# Patient Record
Sex: Male | Born: 1940 | Race: White | Hispanic: No | Smoking: Former smoker
Health system: Southern US, Community
[De-identification: ages and names within clinical notes are randomized; demographics above are authoritative.]

## PROBLEM LIST (undated history)

## (undated) DIAGNOSIS — C801 Malignant (primary) neoplasm, unspecified: Secondary | ICD-10-CM

## (undated) DIAGNOSIS — C719 Malignant neoplasm of brain, unspecified: Secondary | ICD-10-CM

## (undated) DIAGNOSIS — C419 Malignant neoplasm of bone and articular cartilage, unspecified: Secondary | ICD-10-CM

## (undated) DIAGNOSIS — M1990 Unspecified osteoarthritis, unspecified site: Secondary | ICD-10-CM

## (undated) DIAGNOSIS — E119 Type 2 diabetes mellitus without complications: Secondary | ICD-10-CM

## (undated) DIAGNOSIS — C439 Malignant melanoma of skin, unspecified: Secondary | ICD-10-CM

## (undated) DIAGNOSIS — J449 Chronic obstructive pulmonary disease, unspecified: Secondary | ICD-10-CM

## (undated) DIAGNOSIS — I6529 Occlusion and stenosis of unspecified carotid artery: Secondary | ICD-10-CM

## (undated) DIAGNOSIS — I10 Essential (primary) hypertension: Secondary | ICD-10-CM

## (undated) HISTORY — DX: Occlusion and stenosis of unspecified carotid artery: I65.29

## (undated) HISTORY — DX: Type 2 diabetes mellitus without complications: E11.9

## (undated) HISTORY — DX: Essential (primary) hypertension: I10

## (undated) HISTORY — DX: Malignant melanoma of skin, unspecified: C43.9

## (undated) HISTORY — PX: TONSILLECTOMY: SUR1361

---

## 2005-11-25 ENCOUNTER — Encounter: Admission: RE | Admit: 2005-11-25 | Discharge: 2005-11-25 | Payer: Self-pay | Admitting: Family Medicine

## 2005-11-28 ENCOUNTER — Ambulatory Visit (HOSPITAL_COMMUNITY): Admission: RE | Admit: 2005-11-28 | Discharge: 2005-11-28 | Payer: Self-pay | Admitting: Family Medicine

## 2007-02-27 ENCOUNTER — Emergency Department (HOSPITAL_COMMUNITY): Admission: EM | Admit: 2007-02-27 | Discharge: 2007-02-27 | Payer: Self-pay | Admitting: Emergency Medicine

## 2009-05-14 ENCOUNTER — Emergency Department (HOSPITAL_COMMUNITY): Admission: EM | Admit: 2009-05-14 | Discharge: 2009-05-14 | Payer: Self-pay | Admitting: Emergency Medicine

## 2011-01-09 ENCOUNTER — Ambulatory Visit (HOSPITAL_COMMUNITY)
Admission: RE | Admit: 2011-01-09 | Discharge: 2011-01-09 | Disposition: A | Discharge status: Home / Self Care | Payer: Medicare Other | Source: Ambulatory Visit | Attending: Family Medicine | Admitting: Family Medicine

## 2011-01-09 ENCOUNTER — Other Ambulatory Visit (HOSPITAL_COMMUNITY): Payer: Self-pay | Admitting: Family Medicine

## 2011-01-09 DIAGNOSIS — M1610 Unilateral primary osteoarthritis, unspecified hip: Secondary | ICD-10-CM | POA: Insufficient documentation

## 2011-01-09 DIAGNOSIS — M5137 Other intervertebral disc degeneration, lumbosacral region: Secondary | ICD-10-CM | POA: Insufficient documentation

## 2011-01-09 DIAGNOSIS — R52 Pain, unspecified: Secondary | ICD-10-CM

## 2011-01-09 DIAGNOSIS — M51379 Other intervertebral disc degeneration, lumbosacral region without mention of lumbar back pain or lower extremity pain: Secondary | ICD-10-CM | POA: Insufficient documentation

## 2011-01-09 DIAGNOSIS — M545 Low back pain, unspecified: Secondary | ICD-10-CM | POA: Insufficient documentation

## 2011-01-09 DIAGNOSIS — M169 Osteoarthritis of hip, unspecified: Secondary | ICD-10-CM | POA: Insufficient documentation

## 2011-01-09 DIAGNOSIS — M25559 Pain in unspecified hip: Secondary | ICD-10-CM | POA: Insufficient documentation

## 2012-03-10 ENCOUNTER — Ambulatory Visit (HOSPITAL_COMMUNITY)
Admission: RE | Admit: 2012-03-10 | Discharge: 2012-03-10 | Disposition: A | Discharge status: Home / Self Care | Payer: Medicare Other | Source: Ambulatory Visit | Attending: Family Medicine | Admitting: Family Medicine

## 2012-03-10 DIAGNOSIS — M6281 Muscle weakness (generalized): Secondary | ICD-10-CM | POA: Insufficient documentation

## 2012-03-10 DIAGNOSIS — R262 Difficulty in walking, not elsewhere classified: Secondary | ICD-10-CM | POA: Insufficient documentation

## 2012-03-10 DIAGNOSIS — IMO0001 Reserved for inherently not codable concepts without codable children: Secondary | ICD-10-CM | POA: Insufficient documentation

## 2012-03-10 DIAGNOSIS — M545 Low back pain, unspecified: Secondary | ICD-10-CM | POA: Insufficient documentation

## 2012-03-10 NOTE — Evaluation (Signed)
Physical Therapy Evaluation  Patient Details  Name: Todd Poole MRN: [id number] Date of Birth: 1941/05/24  Today's Date: 03/10/2012 Time: 1302-1345 PT Time Calculation (min): 43 min Charges:1 eval Visit#: 1  of 8   Re-eval: 04/09/12 Assessment Diagnosis: LBP Next MD Visit: Dr. Parke Simmers - 2 months Prior Therapy: none  Past Medical History: No past medical history on file. Past Surgical History: No past surgical history on file.  Subjective Symptoms/Limitations Symptoms: Low Back Pain.  PMH: COPD, hyperlipidema, Medicaion: Spiriva and Naproxen Pertinent History: Pt is referred to PT for LBP.  He reports that he has had pain in his low back for years.  he bought a back brace to help control his pain.  He stopped smoking 4 years ago and since has put on 50lbs. His c/co are pain and decreased activity tolerance.  How long can you sit comfortably?: No difficulty How long can you stand comfortably?: Most pain when standing even for a few minutes.  How long can you walk comfortably?: 5 minutes and is limited from back pain and COPD, w/back brace he doesn't have increased pain. Needs to use a grocery cart in the store, which relieves his pain.  Patient Stated Goals: "I want to decrease my pain so that I can walk longer."  Pain Assessment Currently in Pain?: Yes Pain Score:   2 Pain Location: Back Pain Orientation: Left;Right Pain Type: Chronic pain Pain Onset: Other (comment) (a few years) Pain Frequency: Intermittent (only when standing and walking) Pain Relieving Factors: Naproxen, back brace, sitting Effect of Pain on Daily Activities: unable to walk and is unable to lose weight.   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  Leisure: Hobbies-yes (Comment) Comments: Pt enjoys watching TV, going site seeing [place], enjoys swimming.     Cognition/Observation Observation/Other Assessments Observations: chest breather.  Sits with UT elevated, difficulty relaxing even with max cueing.   Other Assessments: Comes in today wearing a back brace.   Sensation/Coordination/Flexibility/Functional Tests Coordination Gross Motor Movements are Fluid and Coordinated: No Coordination and Movement Description: Impaired motor control to TrA and multifidus Hodges Score TrA:  Multifidus: 3/10 (decreased to R multifidus Functional Tests Functional Tests: LEFS: 46/80  Assessment RLE Strength Right Hip Flexion: 4/5 Right Hip Extension: 3+/5 Right Hip ABduction: 4/5 Right Knee Flexion: 5/5 Right Knee Extension: 5/5 LLE Strength Left Hip Flexion: 4/5 Left Hip Extension: 3+/5 Left Hip ABduction: 3+/5 Left Knee Flexion: 5/5 Left Knee Extension: 5/5 Lumbar AROM Lumbar Flexion: decreased 25% -  Lumbar Extension: Decreased 50%; R quadrant decreased  Lumbar - Right Side Bend: WNL Lumbar - Left Side Bend: WNL Palpation Palpation: increased muscle spasms to lumbar parapspinals without increased pain.  Lumbar SP mobility WNL  Mobility/Balance  Ambulation/Gait Ambulation/Gait: Yes Gait Pattern: Antalgic Posture/Postural Control Posture/Postural Control: Postural limitations Postural Limitations: lower and upper cross syndromes    Exercise/Treatments Supine Ab Set: Limitations AB Set Limitations: NMR for activation Bridge: 10 reps Other Supine Lumbar Exercises: NMR for PFC  Prone  Straight Leg Raise: 10 reps Other Prone Lumbar Exercises: NMR for multifidus activation  Physical Therapy Assessment and Plan PT Assessment and Plan Clinical Impression Statement: Pt is a 71 year old male referred to PT for LBP and denies knee pain. After evaluation pt also describes significant generalized weakness and decreased activity tolerance. Pt will benefit from skilled therapeutic intervention in order to improve on  the following deficits:  Pain;Improper body mechanics;Increased muscle spasms;Increased fascial restricitons;Impaired perceived functional ability;Decreased activity tolerance;Impaired flexibility;Decreased strength;Decreased range of motion;Other (comment) (impaired breathing) Rehab Potential: Good PT Frequency: Min 2X/week PT Duration: 6 weeks PT Treatment/Interventions: Gait training;Stair training;Functional mobility training;Therapeutic activities;Therapeutic exercise;Balance training;Neuromuscular re-education;Patient/family education;Manual techniques;Modalities PT Plan: Continue with core strengthening.  Add bike for activity tolerance. continue with core stabilization and review HEP (heel/toe roll in and outs, clams, PFC, TrA and mulitifidus).     Goals Home Exercise Program Pt will Perform Home Exercise Program: Independently PT Goal: Perform Home Exercise Program - Progress: Goal set today PT Short Term Goals Time to Complete Short Term Goals: 2 weeks PT Short Term Goal 1: Pt will imporve core strength to 5/10 for multifidus and TrA musculature. PT Short Term Goal 2: Pt will improve LE strength by 1/2 muscle grade. PT Short Term Goal 3: Pt will improve LE flexibility to quadriceps, hip flexors and hamstrings.  PT Short Term Goal 4: Pt will improve LE flexibility to WNL in order to present with appropriate sitting, standing and walking posture w/mod cueing during an entire therapy regime.  PT Long Term Goals Time to Complete Long Term Goals: Other (comment) (6 weeks) PT Long Term Goal 1: Pt will improve LE and core strength to WNL in order to tolerate walking for greater than 1 hour to return to community mobility to assist with weight loss.  PT Long Term Goal 2: Pt will improve core coordination and activity tolerance in order to tolerate standing for greater than 30 minutes to complete household activities.  Long Term Goal 3: Pt will improve his LEFS to 56/80 for improved percieved functional ability.     Problem List Patient Active Problem List  Diagnosis  . Lumbago  . Muscle pain  . Muscle weakness (generalized)    PT - End of Session Activity Tolerance: Patient tolerated treatment well General Behavior During Session: [facility] for tasks performed Cognition: [facility] for tasks performed PT Plan of Care PT Home Exercise Plan: see scanned report PT Patient Instructions: importance of HEP, diaphragmatic breathing technique.  Consulted and Agree with Plan of Care: Patient  Hilma Steinhilber 03/10/2012, 4:49 PM  Physician Documentation Your signature is required to indicate approval of the treatment plan as stated above.  Please sign and either send electronically or make a copy of this report for your files and return this physician signed original.   Please mark one 1.__approve of plan  2. ___approve of plan with the following conditions.   ______________________________                                                          _____________________ Physician Signature  Date  

## 2012-03-12 ENCOUNTER — Ambulatory Visit (HOSPITAL_COMMUNITY)
Admission: RE | Admit: 2012-03-12 | Discharge: 2012-03-12 | Disposition: A | Discharge status: Home / Self Care | Payer: Medicare Other | Source: Ambulatory Visit | Attending: Family Medicine | Admitting: Family Medicine

## 2012-03-12 NOTE — Progress Notes (Signed)
Physical Therapy Treatment Patient Details  Name: Todd Poole MRN: [id number] Date of Birth: Feb 01, 1941  Today's Date: 03/12/2012 Time: 2956-2130 PT Time Calculation (min): 39 min Charges: 15' NMR, 24' TE Visit#: 2  of 8   Re-eval: 04/09/12   Subjective: Symptoms/Limitations Symptoms: Actually I am feeling a little bit better.  I am not sure if it is the back brace or the exercises, but I am doing pretty well.  Pain Assessment Currently in Pain?: Yes Pain Score:   1 Pain Location: Back Pain Orientation: Mid  Precautions/Restrictions     Exercise/Treatments Aerobic Stationary Bike: 6' @ 2.5 w/cueing to stay above 50 RPM for activity tolerance Machines for Strengthening   Standing Heel Raises: Limitations;20 reps Heel Raises Limitations: Toe Raises 10 reps, sacral/sternal cueing for posture Functional Squats: 10 reps Row: Both;10 reps;Theraband (cueing for proper posture) Theraband Level (Row): Level 4 (Blue) Shoulder Extension: Both;10 reps;Theraband Theraband Level (Shoulder Extension): Level 4 (Blue) Shoulder ADduction: Both;10 reps;Theraband Theraband Level (Shoulder Adduction): Level 4 (Blue) Seated   Supine Ab Set: Limitations AB Set Limitations: NMR for ab set and PFC w/use of VC's and TC's through TrA.  PFC 10x10 sec hold w/10 sec relax after NMR established.  Bridge: 15 reps Other Supine Lumbar Exercises: Diaphragmatic breathing instruction x5 Prone  Straight Leg Raise: Limitations Straight Leg Raises Limitations: NMR for correct activitation, has signficant compensations Other Prone Lumbar Exercises: Knee flexion x10 3#, BLE  Physical Therapy Assessment and Plan PT Assessment and Plan Clinical Impression Statement: Pt reports successful PF activation which is palpated through the TrA.  Able to hold 10x10 sec holds w/use of max multimodal cueing.  Continues to have significant weakness to posteior muscles with demonstration of significant compensations  with hip extension.  Pt will benefit from skilled therapeutic intervention in order to improve on the following deficits:  (impaired breathing) PT Plan: Continue with core strengthening.  Add bike for activity tolerance. continue with core stabilization and review HEP (heel/toe roll in and outs, clams, PFC, TrA and mulitifidus).     Goals    Problem List Patient Active Problem List  Diagnosis  . Lumbago  . Muscle pain  . Muscle weakness (generalized)    PT - End of Session Activity Tolerance: Patient tolerated treatment well General Behavior During Session: [facility] for tasks performed Cognition: [facility] for tasks performed  Odella Appelhans 03/12/2012, 11:19 AM

## 2012-03-17 ENCOUNTER — Ambulatory Visit (HOSPITAL_COMMUNITY): Payer: Medicare Other | Admitting: Physical Therapy

## 2012-03-18 ENCOUNTER — Ambulatory Visit (HOSPITAL_COMMUNITY): Payer: Medicare Other | Admitting: *Deleted

## 2012-03-24 ENCOUNTER — Ambulatory Visit (HOSPITAL_COMMUNITY)
Admission: RE | Admit: 2012-03-24 | Discharge: 2012-03-24 | Disposition: A | Discharge status: Home / Self Care | Payer: Medicare Other | Source: Ambulatory Visit | Attending: Family Medicine | Admitting: Family Medicine

## 2012-03-24 DIAGNOSIS — M6281 Muscle weakness (generalized): Secondary | ICD-10-CM | POA: Insufficient documentation

## 2012-03-24 DIAGNOSIS — M545 Low back pain, unspecified: Secondary | ICD-10-CM | POA: Insufficient documentation

## 2012-03-24 DIAGNOSIS — IMO0001 Reserved for inherently not codable concepts without codable children: Secondary | ICD-10-CM | POA: Insufficient documentation

## 2012-03-24 DIAGNOSIS — R262 Difficulty in walking, not elsewhere classified: Secondary | ICD-10-CM | POA: Insufficient documentation

## 2012-03-24 NOTE — Progress Notes (Signed)
Physical Therapy Treatment Patient Details  Name: Todd Poole MRN: [id number] Date of Birth: [birth year]  Today's Date: 03/24/2012 Time: 4098-1191 PT Time Calculation (min): 37 min  Visit#: 3  of 8   Re-eval: 04/09/12 Charges: Therex x 30'   Subjective: Symptoms/Limitations Symptoms: Pt states taht his back feels weak. Pain Assessment Currently in Pain?: Yes Pain Score:   2 Pain Location: Back Pain Orientation: Mid   Exercise/Treatments Aerobic Stationary Bike: 6' @ 3.0 w/cueing to stay above 50 RPM for activity tolerance Standing Heel Raises: 20 reps Heel Raises Limitations: Toe Raises 10 reps, sacral/sternal cueing for posture Functional Squats: 10 reps (with max cueing for posture) Row: Both;10 reps;Theraband Theraband Level (Row): Level 4 (Blue) Shoulder Extension: Both;10 reps;Theraband Theraband Level (Shoulder Extension): Level 4 (Blue) Shoulder ADduction: Both;10 reps;Theraband Theraband Level (Shoulder Adduction): Level 4 (Blue) Supine Ab Set: 10 reps Bridge: 15 reps;5 seconds Prone  Straight Leg Raise: 10 reps Other Prone Lumbar Exercises: Knee flexion x15 3#, BLE  Physical Therapy Assessment and Plan PT Assessment and Plan Clinical Impression Statement: Pt requires moderates cueing throughout session for posture on all exercises. Pt does complete TrA contraction well this session. Pt reports no increase in pain throughout session. Pt will benefit from skilled therapeutic intervention in order to improve on the following deficits:  (impaired breathing) PT Plan: Continue with core strengthening per PT POC.     Problem List Patient Active Problem List  Diagnosis  . Lumbago  . Muscle pain  . Muscle weakness (generalized)    PT - End of Session Activity Tolerance: Patient tolerated treatment well General Behavior During Session: [facility] for tasks performed Cognition: [facility] for tasks performed  Seth Bake, PTA 03/24/2012, 4:21 PM

## 2012-03-26 ENCOUNTER — Ambulatory Visit (HOSPITAL_COMMUNITY)
Admission: RE | Admit: 2012-03-26 | Discharge: 2012-03-26 | Disposition: A | Discharge status: Home / Self Care | Payer: Medicare Other | Source: Ambulatory Visit | Attending: Family Medicine | Admitting: Family Medicine

## 2012-03-26 NOTE — Progress Notes (Signed)
Physical Therapy Treatment Patient Details  Name: Todd Poole MRN: [id number] Date of Birth: Feb 05, 1941  Today's Date: 03/26/2012 Time: 8295-6213 PT Time Calculation (min): 51 min  Visit#: 4  of 8   Re-eval: 04/09/12 Charges: Therex x 43'    Subjective: Symptoms/Limitations Symptoms: Pt states he is doing his exercises every other day. Pain Assessment Currently in Pain?: Yes Pain Score:   2 Pain Location: Back Pain Orientation: Mid   Exercise/Treatments Aerobic Stationary Bike: 6' @ 3.0 w/cueing to stay above 50 RPM for activity tolerance Standing Heel Raises:  (Time) Functional Squats:  (Time) Row: Both;10 reps;Theraband Theraband Level (Row): Level 4 (Blue) Shoulder Extension: Both;10 reps;Theraband Theraband Level (Shoulder Extension): Level 4 (Blue) Shoulder ADduction: Both;10 reps;Theraband Theraband Level (Shoulder Adduction): Level 4 (Blue) Supine Ab Set: 10 reps Bent Knee Raise: 10 reps Bridge: 15 reps;5 seconds Sidelying Clam: 5 reps;Limitations Clam Limitations: 10" holds Prone  Straight Leg Raise: 10 reps Other Prone Lumbar Exercises: Knee flexion x15 5#, BLE Other Prone Lumbar Exercises: Heel squeezes 10x5"  Physical Therapy Assessment and Plan PT Assessment and Plan Clinical Impression Statement: Pt completes therex with improved form but continues to require min-mod cueing. Pt displays gluteal weakness with prone SLR and heel squeezes. Pt reports pain decrease to 1/10 at end of session. Pt will benefit from skilled therapeutic intervention in order to improve on the following deficits:  (impaired breathing) PT Plan: Continue with core strengthening per PT POC.     Problem List Patient Active Problem List  Diagnosis  . Lumbago  . Muscle pain  . Muscle weakness (generalized)    PT - End of Session Activity Tolerance: Patient tolerated treatment well General Behavior During Session: [facility] for tasks performed Cognition: [facility] for tasks  performed   Seth Bake, PTA 03/26/2012, 2:06 PM

## 2012-03-31 ENCOUNTER — Ambulatory Visit (HOSPITAL_COMMUNITY)
Admission: RE | Admit: 2012-03-31 | Discharge: 2012-03-31 | Disposition: A | Discharge status: Home / Self Care | Payer: Medicare Other | Source: Ambulatory Visit | Attending: Family Medicine | Admitting: Family Medicine

## 2012-03-31 ENCOUNTER — Other Ambulatory Visit (HOSPITAL_COMMUNITY): Payer: Self-pay | Admitting: Family Medicine

## 2012-03-31 DIAGNOSIS — M1250 Traumatic arthropathy, unspecified site: Secondary | ICD-10-CM

## 2012-03-31 DIAGNOSIS — I7090 Unspecified atherosclerosis: Secondary | ICD-10-CM | POA: Insufficient documentation

## 2012-03-31 DIAGNOSIS — M4120 Other idiopathic scoliosis, site unspecified: Secondary | ICD-10-CM | POA: Insufficient documentation

## 2012-03-31 NOTE — Progress Notes (Signed)
Physical Therapy Treatment Patient Details  Name: Todd Poole MRN: [id number] Date of Birth: November 17, 1940  Today's Date: 03/31/2012 Time: 4098-1191 PT Time Calculation (min): 43 min Visit#: 5  of 8   Re-eval: 04/09/12 Charges:  Therex 40'    Subjective: Symptoms/Limitations Symptoms: Pt. states he is no worse/no better.  LBP is about the same. Pain Assessment Currently in Pain?: Yes Pain Score:   2 Pain Location: Back Pain Orientation: Mid   Exercise/Treatments Aerobic Stationary Bike: 6' @ 3.0 w/cueing to stay above 50 RPM for activity tolerance Standing Heel Raises: 20 reps Heel Raises Limitations: Toe Raises 20 reps, sacral/sternal cueing for posture Functional Squats: 10 reps Row: Both;15 reps;Theraband Theraband Level (Row): Level 4 (Blue) Shoulder Extension: Both;15 reps;Theraband Theraband Level (Shoulder Extension): Level 4 (Blue) Shoulder ADduction: Both;15 reps;Theraband Theraband Level (Shoulder Adduction): Level 4 (Blue) Supine Ab Set: 10 reps Bent Knee Raise: 10 reps Bridge: 15 reps;5 seconds Sidelying Clam: 5 reps;Limitations Clam Limitations: 10" holds Prone  Straight Leg Raise: 10 reps Other Prone Lumbar Exercises: Knee flexion x15 5#, BLE Other Prone Lumbar Exercises: Heel squeezes 15x5"     Physical Therapy Assessment and Plan PT Assessment and Plan Clinical Impression Statement: Pt. able to complete all therex, continues to require cueing for form/breathing. Prone SLR most difficult and low coordination/control performing hamstring curls. PT Plan: Continue with core strengthening per PT POC     Problem List Patient Active Problem List  Diagnosis  . Lumbago  . Muscle pain  . Muscle weakness (generalized)    PT - End of Session Activity Tolerance: Patient tolerated treatment well General Behavior During Session: [facility] for tasks performed Cognition: [facility] for tasks performed   Lurena Nida, PTA/CLT 03/31/2012, 1:52 PM

## 2012-04-02 ENCOUNTER — Ambulatory Visit (HOSPITAL_COMMUNITY)
Admission: RE | Admit: 2012-04-02 | Discharge: 2012-04-02 | Disposition: A | Discharge status: Home / Self Care | Payer: Medicare Other | Source: Ambulatory Visit | Attending: Family Medicine | Admitting: Family Medicine

## 2012-04-02 NOTE — Progress Notes (Signed)
Physical Therapy Discharge Summary and Treatment  Patient Details  Name: Todd Poole MRN: [id number] Date of Birth: 08/10/1941  Today's Date: 04/02/2012 Time: 1302-1338 PT Time Calculation (min): 36 min  Visit#: 6  of 8   Re-eval: 04/09/12 Charges: MMT x 1 ROMM x 1 Self care x 10'  Subjective Symptoms/Limitations Symptoms: Pt reprots he has not pain at rest. Pt states that when he's walking his pain goes up to 2/10. Pain Assessment Currently in Pain?: No/denies   Assessment RLE Strength Right Hip Flexion:  (4+/5 was 4/5) Right Hip Extension: 4/5 (was 3+/5) Right Hip ABduction: 5/5 (was 4/5) Right Knee Flexion: 5/5 (was 5/5) Right Knee Extension: 5/5 (was 5/5) LLE Strength Left Hip Flexion: 5/5 (was 4/5) Left Hip Extension: 4/5 (was 3+/5) Left Hip ABduction:  (4+/5 was 3+/5) Left Knee Flexion: 5/5 (was 5/5) Left Knee Extension: 5/5 (was 5/5) Lumbar AROM Lumbar Flexion: decreased 15% (was decreased 25%) Lumbar Extension: Decreased 20%; R quadrant decreased  (was decreased 50%) Lumbar - Right Side Bend: WNL Lumbar - Left Side Bend: WNL  LEFS remains 46/80  Exercise/Treatments  Stretches Passive Hamstring Stretch: 1 rep;30 seconds (for HEP)  Physical Therapy Assessment and Plan PT Assessment and Plan Clinical Impression Statement: Pt displays improvements in strength and ROM. Pt states that he feels a significant increase in his strength. Pt would benefit from continued therapy. Pt was made aware of this. Pt would like to continue HEP at home and D/C from therapy. Pt is content with his progress with therapy. PT Plan: Recommend D/C to HEP per pt request.    Goals Home Exercise Program Pt will Perform Home Exercise Program: Independently PT Short Term Goals Time to Complete Short Term Goals: 2 weeks PT Short Term Goal 1: Pt will imporve core strength to 5/10 for multifidus and TrA musculature. PT Short Term Goal 1 - Progress: Met PT Short Term Goal 2: Pt will  improve LE strength by 1/2 muscle grade. PT Short Term Goal 2 - Progress: Met PT Short Term Goal 3: Pt will improve LE flexibility to quadriceps, hip flexors and hamstrings.  PT Short Term Goal 3 - Progress: Progressing toward goal PT Short Term Goal 4: Pt will improve LE flexibility to WNL in order to present with appropriate sitting, standing and walking posture w/mod cueing during an entire therapy regime.  PT Short Term Goal 4 - Progress: Progressing toward goal PT Long Term Goals Time to Complete Long Term Goals: Other (comment) (6 weeks) PT Long Term Goal 1: Pt will improve LE and core strength to WNL in order to tolerate walking for greater than 1 hour to return to community mobility to assist with weight loss.  PT Long Term Goal 1 - Progress: Partly met PT Long Term Goal 2: Pt will improve core coordination and activity tolerance in order to tolerate standing for greater than 30 minutes to complete household activities.  PT Long Term Goal 2 - Progress: Progressing toward goal (Pt can stand 10-15' before needing to sit) Long Term Goal 3: Pt will improve his LEFS to 56/80 for improved percieved functional ability.   Long Term Goal 3 Progress: Not met (Remained 46/80)  Problem List Patient Active Problem List  Diagnosis  . Lumbago  . Muscle pain  . Muscle weakness (generalized)    PT - End of Session Activity Tolerance: Patient tolerated treatment well General Behavior During Session: [facility] for tasks performed Cognition: [facility] for tasks performed PT Plan of Care PT Home Exercise Plan:  Passive hamstring stretch and scapular tband exercises given.   Seth Bake, PTA 04/02/2012, 4:37 PM  Physician Documentation Your signature is required to indicate approval of the treatment plan as stated above.  Please sign and either send electronically or make a copy of this report for your files and return this physician signed original.   Please mark one 1.__approve of plan  2. ___approve  of plan with the following conditions.   ______________________________                                                          _____________________ Physician Signature                                                                                                             Date

## 2012-05-21 ENCOUNTER — Encounter (HOSPITAL_COMMUNITY): Payer: Self-pay | Admitting: *Deleted

## 2012-05-21 ENCOUNTER — Emergency Department (HOSPITAL_COMMUNITY)
Admission: EM | Admit: 2012-05-21 | Discharge: 2012-05-22 | Disposition: A | Discharge status: Home / Self Care | Payer: Medicare Other | Attending: Emergency Medicine | Admitting: Emergency Medicine

## 2012-05-21 DIAGNOSIS — J4489 Other specified chronic obstructive pulmonary disease: Secondary | ICD-10-CM | POA: Insufficient documentation

## 2012-05-21 DIAGNOSIS — Z87891 Personal history of nicotine dependence: Secondary | ICD-10-CM | POA: Insufficient documentation

## 2012-05-21 DIAGNOSIS — J449 Chronic obstructive pulmonary disease, unspecified: Secondary | ICD-10-CM | POA: Insufficient documentation

## 2012-05-21 DIAGNOSIS — M129 Arthropathy, unspecified: Secondary | ICD-10-CM | POA: Insufficient documentation

## 2012-05-21 DIAGNOSIS — M7989 Other specified soft tissue disorders: Secondary | ICD-10-CM | POA: Insufficient documentation

## 2012-05-21 HISTORY — DX: Chronic obstructive pulmonary disease, unspecified: J44.9

## 2012-05-21 HISTORY — DX: Unspecified osteoarthritis, unspecified site: M19.90

## 2012-05-21 NOTE — ED Notes (Signed)
Pt reports swelling in right lower extremity for the past 2 weeks that has not gone down as normal.  pitting edema noted.

## 2012-05-21 NOTE — ED Notes (Signed)
Triage EMT states that pt has left without informing staff of leaving

## 2012-05-21 NOTE — ED Notes (Signed)
Pt found waiting in waiting room

## 2012-05-21 NOTE — ED Notes (Signed)
C/o swelling to right foot for 2 weeks per pt., SOB due to COPD and has quit smoking

## 2012-05-22 ENCOUNTER — Ambulatory Visit (HOSPITAL_COMMUNITY)
Admit: 2012-05-22 | Discharge: 2012-05-22 | Disposition: A | Discharge status: Home / Self Care | Payer: Medicare Other | Source: Ambulatory Visit | Attending: Emergency Medicine | Admitting: Emergency Medicine

## 2012-05-22 DIAGNOSIS — M7989 Other specified soft tissue disorders: Secondary | ICD-10-CM | POA: Insufficient documentation

## 2012-05-22 DIAGNOSIS — M79609 Pain in unspecified limb: Secondary | ICD-10-CM | POA: Insufficient documentation

## 2012-05-22 NOTE — ED Provider Notes (Signed)
History     CSN: [id number]  Arrival date & time 05/21/12  2047   First MD Initiated Contact with Patient 05/22/12 0040      Chief Complaint  Patient presents with  . Leg Swelling    (Consider location/radiation/quality/duration/timing/severity/associated sxs/prior treatment) The history is provided by the patient.  patient is a 71 year old male presents with concern for swelling to his right foot and leg that has been going on for about 2 weeks. Patient does have baseline shortness of breath due to COPD but no worsening of that chest pain. Patient is followed by Dr. Parke Simmers.  The foot has no pain there was no recent injury.  Past Medical History  Diagnosis Date  . COPD (chronic obstructive pulmonary disease)   . Arthritis     History reviewed. No pertinent past surgical history.  History reviewed. No pertinent family history.  History  Substance Use Topics  . Smoking status: Former Games developer  . Smokeless tobacco: Not on file  . Alcohol Use: No      Review of Systems  Constitutional: Negative for fever.  HENT: Negative for neck pain.   Eyes: Negative for visual disturbance.  Respiratory: Positive for shortness of breath.   Cardiovascular: Negative for chest pain.  Gastrointestinal: Negative for abdominal pain.  Genitourinary: Negative for dysuria.  Musculoskeletal: Negative for back pain.  Skin: Positive for rash.  Neurological: Negative for headaches.  Hematological: Does not bruise/bleed easily.    Allergies  Review of patient's allergies indicates no known allergies.  Home Medications  No current outpatient prescriptions on file.  BP 157/55  Pulse 68  Temp 97.7 F (36.5 C) (Oral)  Resp 18  Ht 5\' 7"  (1.702 m)  Wt 226 lb 9 oz (102.768 kg)  BMI 35.48 kg/m2  SpO2 99%  Physical Exam  Nursing note and vitals reviewed. Constitutional: He is oriented to person, place, and time. He appears well-developed and well-nourished. No distress.  HENT:  Head:  Normocephalic and atraumatic.  Mouth/Throat: Oropharynx is clear and moist.  Eyes: Conjunctivae and EOM are normal. Pupils are equal, round, and reactive to light.  Neck: Normal range of motion. Neck supple.  Cardiovascular: Normal rate, regular rhythm and normal heart sounds.   No murmur heard. Pulmonary/Chest: Effort normal and breath sounds normal. He has no wheezes.  Abdominal: Soft. Bowel sounds are normal. There is no tenderness.  Musculoskeletal: He exhibits edema. He exhibits no tenderness.  Neurological: He is alert and oriented to person, place, and time. No cranial nerve deficit. He exhibits normal muscle tone. Coordination normal.       Cap refill to both lower extremities is 1 second or better. Patient has swelling of the distal part of the right foot and swelling of the leg. Has resolving poison ivy rash on both feet. No palpable cords in the back of the right leg swelling only goes up to the knee. But most of this at the ankle and the distal foot.  Skin: Skin is warm. No rash noted.    ED Course  Procedures (including critical care time)  Labs Reviewed - No data to display No results found.   1. Leg swelling       MDM  Patient with several days to week history of right leg swelling and right foot swelling no chest pain or new shortness of breath he has a history of COPD. Have arranged for Doppler study later this morning patient will return to have it done. Patient has primary care doctor  he can followup with. No significant swelling of the left extremity.        Shelda Jakes, MD 05/22/12 [phone]

## 2012-05-22 NOTE — ED Notes (Signed)
Pt alert & oriented x4, stable gait. Patient given discharge instructions, paperwork & prescription(s). Patient  instructed to stop at the registration desk to finish any additional paperwork. Patient verbalized understanding. Pt left department w/ no further questions. 

## 2012-09-07 ENCOUNTER — Encounter: Payer: Self-pay | Admitting: Family Medicine

## 2012-09-07 ENCOUNTER — Ambulatory Visit (INDEPENDENT_AMBULATORY_CARE_PROVIDER_SITE_OTHER): Payer: Medicare Other | Admitting: Family Medicine

## 2012-09-07 VITALS — BP 140/70 | HR 90 | Resp 15 | Ht 66.5 in | Wt 232.1 lb

## 2012-09-07 DIAGNOSIS — R030 Elevated blood-pressure reading, without diagnosis of hypertension: Secondary | ICD-10-CM

## 2012-09-07 DIAGNOSIS — J449 Chronic obstructive pulmonary disease, unspecified: Secondary | ICD-10-CM

## 2012-09-07 DIAGNOSIS — R7309 Other abnormal glucose: Secondary | ICD-10-CM

## 2012-09-07 DIAGNOSIS — Z85820 Personal history of malignant melanoma of skin: Secondary | ICD-10-CM

## 2012-09-07 DIAGNOSIS — M7989 Other specified soft tissue disorders: Secondary | ICD-10-CM

## 2012-09-07 DIAGNOSIS — I10 Essential (primary) hypertension: Secondary | ICD-10-CM

## 2012-09-07 DIAGNOSIS — R7303 Prediabetes: Secondary | ICD-10-CM

## 2012-09-07 MED ORDER — FLUTICASONE-SALMETEROL 250-50 MCG/DOSE IN AEPB: 1.0000 | INHALATION_SPRAY | Freq: Two times a day (BID) | RESPIRATORY_TRACT | Status: DC

## 2012-09-07 MED ORDER — HYDROCHLOROTHIAZIDE 25 MG PO TABS: 25.0000 mg | ORAL_TABLET | Freq: Every day | ORAL | Status: DC

## 2012-09-07 NOTE — Patient Instructions (Addendum)
Start HCTZ for your leg swelling and blood pressure New inhaler advair for your breathing Get labs done today, we will call with results Handi-cap form F/U 6 weeks

## 2012-09-08 LAB — CBC
HCT: 45.5 % (ref 39.0–52.0)
Hemoglobin: 15.6 g/dL (ref 13.0–17.0)
MCHC: 34.3 g/dL (ref 30.0–36.0)
MCV: 90.5 fL (ref 78.0–100.0)
RBC: 5.03 MIL/uL (ref 4.22–5.81)
RDW: 13.3 % (ref 11.5–15.5)
WBC: 6.3 10*3/uL (ref 4.0–10.5)

## 2012-09-08 LAB — COMPREHENSIVE METABOLIC PANEL
AST: 20 U/L (ref 0–37)
Albumin: 4.4 g/dL (ref 3.5–5.2)
Alkaline Phosphatase: 45 U/L (ref 39–117)
BUN: 19 mg/dL (ref 6–23)
CO2: 28 mEq/L (ref 19–32)
Calcium: 9.2 mg/dL (ref 8.4–10.5)
Creat: 1.47 mg/dL — ABNORMAL HIGH (ref 0.50–1.35)
Glucose, Bld: 110 mg/dL — ABNORMAL HIGH (ref 70–99)
Total Bilirubin: 0.5 mg/dL (ref 0.3–1.2)

## 2012-09-08 LAB — HEMOGLOBIN A1C: Mean Plasma Glucose: 140 mg/dL — ABNORMAL HIGH (ref <117)

## 2012-09-08 LAB — LIPID PANEL
Cholesterol: 143 mg/dL (ref 0–200)
HDL: 35 mg/dL — ABNORMAL LOW (ref >39)

## 2012-09-11 ENCOUNTER — Encounter: Payer: Self-pay | Admitting: Family Medicine

## 2012-09-11 DIAGNOSIS — J449 Chronic obstructive pulmonary disease, unspecified: Secondary | ICD-10-CM | POA: Insufficient documentation

## 2012-09-11 DIAGNOSIS — M7989 Other specified soft tissue disorders: Secondary | ICD-10-CM | POA: Insufficient documentation

## 2012-09-11 DIAGNOSIS — Z85820 Personal history of malignant melanoma of skin: Secondary | ICD-10-CM | POA: Insufficient documentation

## 2012-09-11 DIAGNOSIS — I10 Essential (primary) hypertension: Secondary | ICD-10-CM | POA: Insufficient documentation

## 2012-09-11 NOTE — Assessment & Plan Note (Signed)
Trial of advair for breathing, obtain records

## 2012-09-11 NOTE — Progress Notes (Signed)
  Subjective:    Patient ID: Todd Poole, male    DOB: 05-19-1941, 71 y.o.   MRN: [id number]  HPI Pt here to establish care, previous PCP Dr. Ocie Bob, Dermatology in  due to melanoma onleg  COPD quit smoking 5 years ago, had PFT 8 years ago, has been on spiriva but sees no change and wants to try diffierent inhaler, immunizations UTD, + Flu, + pneumonia, + shingles Leg swelling on and off He has been trying to go to [facility] a few times a week for sliver sneakers program +Colonoscopy- polyps does not remember date Medications and history reviewed   Review of Systems - per above    GEN- denies fatigue, fever, weight loss,weakness, recent illness HEENT- denies eye drainage, change in vision, nasal discharge, CVS- denies chest pain, palpitations RESP- + SOB, cough, wheeze ABD- denies N/V, change in stools, abd pain GU- denies dysuria, hematuria, dribbling, incontinence MSK- + joint pain, muscle aches, injury Neuro- denies headache, dizziness, syncope, seizure activity      Objective:   Physical Exam GEN- NAD, alert and oriented x3 HEENT- PERRL, EOMI, non injected sclera, pink conjunctiva, MMM, oropharynx clear Neck- Supple, CVS- RRR, no murmur RESP-CTAB ABD-NABS,soft,NT,ND EXT- +pedal  edema Pulses- Radial, DP- 2+ Psych- normal affect and mood        Assessment & Plan:

## 2012-09-11 NOTE — Assessment & Plan Note (Signed)
Start HCTZ, fasting labs

## 2012-09-11 NOTE — Assessment & Plan Note (Signed)
Mild leg swelling, elevate, low dose diuretic

## 2012-09-11 NOTE — Assessment & Plan Note (Signed)
Fasting labs, start HCTZ for BP and leg swelling

## 2012-10-19 ENCOUNTER — Ambulatory Visit (INDEPENDENT_AMBULATORY_CARE_PROVIDER_SITE_OTHER): Payer: Medicare Other | Admitting: Family Medicine

## 2012-10-19 ENCOUNTER — Encounter: Payer: Self-pay | Admitting: Family Medicine

## 2012-10-19 VITALS — BP 140/70 | HR 96 | Resp 16 | Ht 66.5 in | Wt 222.4 lb

## 2012-10-19 DIAGNOSIS — E669 Obesity, unspecified: Secondary | ICD-10-CM

## 2012-10-19 DIAGNOSIS — E119 Type 2 diabetes mellitus without complications: Secondary | ICD-10-CM

## 2012-10-19 DIAGNOSIS — M7989 Other specified soft tissue disorders: Secondary | ICD-10-CM

## 2012-10-19 DIAGNOSIS — J449 Chronic obstructive pulmonary disease, unspecified: Secondary | ICD-10-CM

## 2012-10-19 DIAGNOSIS — I10 Essential (primary) hypertension: Secondary | ICD-10-CM

## 2012-10-19 MED ORDER — HYDROCHLOROTHIAZIDE 25 MG PO TABS: 25.0000 mg | ORAL_TABLET | Freq: Every day | ORAL | Status: DC

## 2012-10-19 NOTE — Patient Instructions (Signed)
Start taking baby aspirin 81mg  Continue the blood pressure medications Congratulations on the weight loss Review handout on good foods to eat  Avoid cookies, cakes, junk food, soda, fried foods F/U 3 months

## 2012-10-19 NOTE — Progress Notes (Signed)
  Subjective:    Patient ID: Todd Poole, male    DOB: Dec 09, 1940, 72 y.o.   MRN: [id number]  HPI  Patient here to followup high blood pressure and labs. He's been doing well on medication his leg swelling has resolved. He's been working out the [employer] and has lost 10 pounds since her last visit. He does admit to not being very well and eating a lot of junkfood. His labs are reviewed he does have diabetes mellitus A1c was 6.5%. He has a spot on his back he would like me to check it is been present for more than a year but will not go away he does have a history of melanoma, no itching or pain  Review of Systems   GEN- denies fatigue, fever, weight loss,weakness, recent illness HEENT- denies eye drainage, change in vision, nasal discharge, CVS- denies chest pain, palpitations RESP- denies SOB, cough, wheeze ABD- denies N/V, change in stools, abd pain GU- denies dysuria, hematuria, dribbling, incontinence MSK- + joint pain, muscle aches, injury Neuro- denies headache, dizziness, syncope, seizure activity      Objective:   Physical Exam GEN- NAD, alert and oriented x3,+ weight loss HEENT- PERRL, EOMI, non injected sclera, pink conjunctiva, MMM, oropharynx clear Neck- Supple,  CVS- RRR, no murmur RESP-CTAB EXT- No edema Pulses- Radial, DP- 2+ Skin- multiple keratosis on back, Right mid back, rasised, white dry appearing lesion, NT,no erythema, no pearly appearance       Assessment & Plan:

## 2012-10-21 DIAGNOSIS — E119 Type 2 diabetes mellitus without complications: Secondary | ICD-10-CM | POA: Insufficient documentation

## 2012-10-21 DIAGNOSIS — E669 Obesity, unspecified: Secondary | ICD-10-CM | POA: Insufficient documentation

## 2012-10-21 NOTE — Assessment & Plan Note (Signed)
Improved with advair

## 2012-10-21 NOTE — Assessment & Plan Note (Signed)
Will allow him to work on diet, he has lost 10lbs, no meds at this time needed

## 2012-10-21 NOTE — Assessment & Plan Note (Signed)
Blood pressure well controlled, peripheral edema improved Add Aspirin 81mg 

## 2012-10-21 NOTE — Assessment & Plan Note (Signed)
10lb weight loss, working out at [employer]

## 2012-10-21 NOTE — Assessment & Plan Note (Signed)
improved

## 2013-01-19 ENCOUNTER — Encounter: Payer: Self-pay | Admitting: Family Medicine

## 2013-01-19 ENCOUNTER — Ambulatory Visit (INDEPENDENT_AMBULATORY_CARE_PROVIDER_SITE_OTHER): Payer: Medicare Other | Admitting: Family Medicine

## 2013-01-19 VITALS — BP 128/64 | HR 89 | Resp 16 | Wt 220.0 lb

## 2013-01-19 DIAGNOSIS — M1990 Unspecified osteoarthritis, unspecified site: Secondary | ICD-10-CM

## 2013-01-19 DIAGNOSIS — E119 Type 2 diabetes mellitus without complications: Secondary | ICD-10-CM

## 2013-01-19 DIAGNOSIS — I10 Essential (primary) hypertension: Secondary | ICD-10-CM

## 2013-01-19 DIAGNOSIS — L609 Nail disorder, unspecified: Secondary | ICD-10-CM

## 2013-01-19 NOTE — Patient Instructions (Addendum)
Call [facility] Okay to go for colonoscopy Referral to foot doctor for nails Continue current medications Watch your diet Labs today we will call with results F/U 4 months [employer]

## 2013-01-19 NOTE — Progress Notes (Signed)
  Subjective:    Patient ID: Todd Poole, male    DOB: 22-May-1941, 72 y.o.   MRN: [id number]  HPI  Pt here to f/u chronic medical problems, no specific concerns Working on diet and exercise. Medications reviewed Breathing has been well.  Handicap form to be completed  Review of Systems   GEN- denies fatigue, fever, weight loss,weakness, recent illness HEENT- denies eye drainage, change in vision, nasal discharge, CVS- denies chest pain, palpitations RESP- denies SOB, cough, wheeze ABD- denies N/V, change in stools, abd pain GU- denies dysuria, hematuria, dribbling, incontinence MSK- denies joint pain, muscle aches, injury Neuro- denies headache, dizziness, syncope, seizure activity      Objective:   Physical Exam  GEN- NAD, alert and oriented x3 HEENT- PERRL, EOMI, non injected sclera, pink conjunctiva, MMM, oropharynx clear CVS- RRR, no murmur RESP-CTAB EXT- Trace pedal edema Pulses- Radial, DP- 2+       Assessment & Plan:

## 2013-01-20 DIAGNOSIS — M1990 Unspecified osteoarthritis, unspecified site: Secondary | ICD-10-CM | POA: Insufficient documentation

## 2013-01-20 DIAGNOSIS — L609 Nail disorder, unspecified: Secondary | ICD-10-CM | POA: Insufficient documentation

## 2013-01-20 LAB — MICROALBUMIN / CREATININE URINE RATIO
Creatinine, Urine: 134 mg/dL
Microalb Creat Ratio: 11.5 mg/g (ref 0.0–30.0)

## 2013-01-20 NOTE — Assessment & Plan Note (Addendum)
Diet controlled, A1C 6.4% Schedule Eye appt Podiatry appt

## 2013-01-20 NOTE — Assessment & Plan Note (Signed)
Well controlled 

## 2013-01-20 NOTE — Assessment & Plan Note (Signed)
OTC meds as needed Uses cane as needed Handicap form completed

## 2013-01-20 NOTE — Assessment & Plan Note (Signed)
Refer to podiatry

## 2013-03-25 ENCOUNTER — Other Ambulatory Visit: Payer: Self-pay

## 2013-05-24 ENCOUNTER — Ambulatory Visit: Payer: Self-pay | Admitting: Family Medicine

## 2013-05-25 ENCOUNTER — Ambulatory Visit (INDEPENDENT_AMBULATORY_CARE_PROVIDER_SITE_OTHER): Payer: Medicare Other | Admitting: Family Medicine

## 2013-05-25 ENCOUNTER — Encounter: Payer: Self-pay | Admitting: Family Medicine

## 2013-05-25 VITALS — BP 130/80 | HR 82 | Temp 97.3°F | Resp 18 | Wt 229.0 lb

## 2013-05-25 DIAGNOSIS — D229 Melanocytic nevi, unspecified: Secondary | ICD-10-CM

## 2013-05-25 DIAGNOSIS — I10 Essential (primary) hypertension: Secondary | ICD-10-CM

## 2013-05-25 DIAGNOSIS — Z85820 Personal history of malignant melanoma of skin: Secondary | ICD-10-CM

## 2013-05-25 DIAGNOSIS — E119 Type 2 diabetes mellitus without complications: Secondary | ICD-10-CM

## 2013-05-25 DIAGNOSIS — D239 Other benign neoplasm of skin, unspecified: Secondary | ICD-10-CM

## 2013-05-25 NOTE — Patient Instructions (Addendum)
Continue current medications Flu shot given We will call with lab results Referral to dermatology F/U 4 months

## 2013-05-25 NOTE — Progress Notes (Signed)
  Subjective:    Patient ID: Todd Poole, male    DOB: June 08, 1941, 72 y.o.   MRN: [id number]  HPI  Patient here to followup chronic medical problems.Marland Kitchen He is status post colonoscopy and has had his eye exam and podiatry visit. Medications were reviewed Last set of labs reviewed He would like to be referred back to dermatology for complete mole check, history of melanoma in the past Review of Systems   GEN- denies fatigue, fever, weight loss,weakness, recent illness HEENT- denies eye drainage, change in vision, nasal discharge, CVS- denies chest pain, palpitations RESP- denies SOB, cough, wheeze ABD- denies N/V, change in stools, abd pain GU- denies dysuria, hematuria, dribbling, incontinence MSK- denies joint pain, muscle aches, injury Neuro- denies headache, dizziness, syncope, seizure activity \    Objective:   Physical Exam GEN- NAD, alert and oriented x3 HEENT- PERRL, EOMI, non injected sclera, pink conjunctiva, MMM, oropharynx clear Neck- Supple, no bruit CVS- RRR, no murmur RESP-CTAB EXT- trace edema Pulses- Radial, DP- 2+ sKIN- multiple flesh toned moles on back and arms, 1 hyperpigemented round lesion center of back, seborrheic keratotis noted scattered on skin       Assessment & Plan:

## 2013-05-26 LAB — CBC WITH DIFFERENTIAL/PLATELET
HCT: 43.2 % (ref 39.0–52.0)
Hemoglobin: 14.7 g/dL (ref 13.0–17.0)
Lymphocytes Relative: 23 % (ref 12–46)
MCH: 30.9 pg (ref 26.0–34.0)
MCHC: 34 g/dL (ref 30.0–36.0)
MCV: 90.9 fL (ref 78.0–100.0)
Monocytes Relative: 7 % (ref 3–12)
Platelets: 203 10*3/uL (ref 150–400)
RDW: 13 % (ref 11.5–15.5)
WBC: 7.2 10*3/uL (ref 4.0–10.5)

## 2013-05-26 LAB — COMPREHENSIVE METABOLIC PANEL
ALT: 22 U/L (ref 0–53)
AST: 18 U/L (ref 0–37)
Albumin: 3.6 g/dL (ref 3.5–5.2)
Creat: 1.3 mg/dL (ref 0.50–1.35)
Sodium: 140 mEq/L (ref 135–145)

## 2013-05-26 LAB — HEMOGLOBIN A1C: Mean Plasma Glucose: 146 mg/dL — ABNORMAL HIGH (ref <117)

## 2013-05-26 LAB — LIPID PANEL
LDL Cholesterol: 74 mg/dL (ref 0–99)
Total CHOL/HDL Ratio: 4 Ratio

## 2013-05-27 ENCOUNTER — Encounter: Payer: Self-pay | Admitting: Family Medicine

## 2013-05-27 DIAGNOSIS — D229 Melanocytic nevi, unspecified: Secondary | ICD-10-CM | POA: Insufficient documentation

## 2013-05-27 NOTE — Assessment & Plan Note (Signed)
Well controlled 

## 2013-05-27 NOTE — Assessment & Plan Note (Signed)
Refer to dermatology 

## 2013-05-27 NOTE — Assessment & Plan Note (Signed)
Repeat A1C obtained looks good, diet control Continue to work on diet and weight loss

## 2013-05-28 NOTE — Progress Notes (Signed)
Number has been disconnected will send out letter

## 2013-06-01 ENCOUNTER — Encounter: Payer: Self-pay | Admitting: Family Medicine

## 2013-08-13 ENCOUNTER — Telehealth: Payer: Self-pay | Admitting: Family Medicine

## 2013-08-13 MED ORDER — HYDROCHLOROTHIAZIDE 25 MG PO TABS: 25.0000 mg | ORAL_TABLET | Freq: Every day | ORAL | Status: DC

## 2013-08-13 NOTE — Telephone Encounter (Signed)
Req RF Hydrodiuril   Medication refilled per protocol.

## 2013-09-24 ENCOUNTER — Ambulatory Visit (INDEPENDENT_AMBULATORY_CARE_PROVIDER_SITE_OTHER): Payer: Medicare Other | Admitting: Family Medicine

## 2013-09-24 ENCOUNTER — Encounter: Payer: Self-pay | Admitting: Family Medicine

## 2013-09-24 VITALS — BP 130/70 | HR 78 | Temp 97.6°F | Resp 20 | Ht 66.0 in | Wt 231.0 lb

## 2013-09-24 DIAGNOSIS — I739 Peripheral vascular disease, unspecified: Secondary | ICD-10-CM

## 2013-09-24 DIAGNOSIS — E669 Obesity, unspecified: Secondary | ICD-10-CM

## 2013-09-24 DIAGNOSIS — J449 Chronic obstructive pulmonary disease, unspecified: Secondary | ICD-10-CM

## 2013-09-24 DIAGNOSIS — R0989 Other specified symptoms and signs involving the circulatory and respiratory systems: Secondary | ICD-10-CM

## 2013-09-24 DIAGNOSIS — I10 Essential (primary) hypertension: Secondary | ICD-10-CM

## 2013-09-24 DIAGNOSIS — E119 Type 2 diabetes mellitus without complications: Secondary | ICD-10-CM

## 2013-09-24 LAB — BASIC METABOLIC PANEL WITH GFR
BUN: 17 mg/dL (ref 6–23)
CO2: 26 meq/L (ref 19–32)
Calcium: 9.4 mg/dL (ref 8.4–10.5)
Chloride: 102 mEq/L (ref 96–112)
Creat: 2.13 mg/dL — ABNORMAL HIGH (ref 0.50–1.35)
GFR, Est African American: 34 mL/min — ABNORMAL LOW
GFR, Est Non African American: 30 mL/min — ABNORMAL LOW
GLUCOSE: 108 mg/dL — AB (ref 70–99)
Potassium: 4.4 mEq/L (ref 3.5–5.3)
Sodium: 139 mEq/L (ref 135–145)

## 2013-09-24 LAB — HEMOGLOBIN A1C
HEMOGLOBIN A1C: 6.9 % — AB (ref <5.7)
Mean Plasma Glucose: 151 mg/dL — ABNORMAL HIGH (ref <117)

## 2013-09-24 MED ORDER — FLUTICASONE-SALMETEROL 250-50 MCG/DOSE IN AEPB: 1.0000 | INHALATION_SPRAY | Freq: Two times a day (BID) | RESPIRATORY_TRACT | Status: DC

## 2013-09-24 MED ORDER — HYDROCHLOROTHIAZIDE 25 MG PO TABS: 25.0000 mg | ORAL_TABLET | Freq: Every day | ORAL | Status: DC

## 2013-09-24 NOTE — Patient Instructions (Signed)
Continue current medications We will call with lab results Ultrasound of carotids to be done Ultrasound on legs for circulation Extra strength Tylenol ( 500mg ) 1 tablet twice a day fore arthritis pain F/U 3 months

## 2013-09-24 NOTE — Progress Notes (Signed)
   Subjective:    Patient ID: Todd Poole, male    DOB: 12-Jun-1941, 73 y.o.   MRN: [id number]  HPI Patient here follow chronic medical problems. He does continue to have back pain on and off secondary to his degenerative disc disease he does not have any radiation of the pain. He has not been using any over-the-counter medications recently   Diabetes mellitus-currently diet controlled however he has gained some weight since her last visit. He's due for recheck today  denies any polyuria polydipsia.  COPD-he is doing well on Advair no concerns today.  He still being followed by podiatry but states that his feet get cold to touch and he gets a burning sensation in his feet and legs at times Review of Systems  GEN- denies fatigue, fever, weight loss,weakness, recent illness HEENT- denies eye drainage, change in vision, nasal discharge, CVS- denies chest pain, palpitations RESP- denies SOB, cough, wheeze ABD- denies N/V, change in stools, abd pain GU- denies dysuria, hematuria, dribbling, incontinence MSK-+ joint pain, muscle aches, injury Neuro- denies headache, dizziness, syncope, seizure activity      Objective:   Physical Exam  GEN- NAD, alert and oriented x3 HEENT- PERRL, EOMI, non injected sclera, pink conjunctiva, MMM, oropharynx clear Neck- Supple, + bruit bilat CVS- RRR, no murmur RESP-CTAB ABD-NABS,soft,NT,ND Back- Mild TTP lumbar spine, neg SLR, fair ROM hips, Decreased ROM Spine EXT- trace edema Pulses- Radial 2+, DP- unable to palpate, toes cool to touch bilat, no color changes in feet      Assessment & Plan:

## 2013-09-24 NOTE — Assessment & Plan Note (Signed)
Check A1c today he may need to start medications

## 2013-09-24 NOTE — Assessment & Plan Note (Signed)
He is doing well with his breathing will continue the Advair

## 2013-09-24 NOTE — Assessment & Plan Note (Signed)
Discussed the importance of weight loss and healthy eating

## 2013-09-24 NOTE — Assessment & Plan Note (Signed)
Blood pressure is well controlled 

## 2013-09-24 NOTE — Assessment & Plan Note (Signed)
Concern about PAD based on his symptoms and also to decrease pulses today. I will obtain arterial studies on his lower extremities.

## 2013-09-24 NOTE — Assessment & Plan Note (Signed)
I will obtain ultrasound of the CAROTIDS

## 2013-09-28 ENCOUNTER — Other Ambulatory Visit: Payer: Self-pay | Admitting: Family Medicine

## 2013-09-28 DIAGNOSIS — N179 Acute kidney failure, unspecified: Secondary | ICD-10-CM

## 2013-09-29 ENCOUNTER — Other Ambulatory Visit: Payer: Medicare Other

## 2013-09-29 DIAGNOSIS — N179 Acute kidney failure, unspecified: Secondary | ICD-10-CM

## 2013-09-29 LAB — URINALYSIS, ROUTINE W REFLEX MICROSCOPIC
BILIRUBIN URINE: NEGATIVE
GLUCOSE, UA: NEGATIVE mg/dL
Ketones, ur: NEGATIVE mg/dL
Leukocytes, UA: NEGATIVE
Nitrite: NEGATIVE
Protein, ur: NEGATIVE mg/dL
Specific Gravity, Urine: 1.02 (ref 1.005–1.030)
Urobilinogen, UA: 0.2 mg/dL (ref 0.0–1.0)
pH: 6 (ref 5.0–8.0)

## 2013-09-29 LAB — URINALYSIS, MICROSCOPIC ONLY: Crystals: NONE SEEN

## 2013-09-29 LAB — BASIC METABOLIC PANEL
BUN: 19 mg/dL (ref 6–23)
CO2: 30 meq/L (ref 19–32)
CREATININE: 1.61 mg/dL — AB (ref 0.50–1.35)
Calcium: 9.5 mg/dL (ref 8.4–10.5)
Chloride: 100 mEq/L (ref 96–112)
Glucose, Bld: 111 mg/dL — ABNORMAL HIGH (ref 70–99)
POTASSIUM: 3.8 meq/L (ref 3.5–5.3)
Sodium: 140 mEq/L (ref 135–145)

## 2013-09-30 ENCOUNTER — Ambulatory Visit (HOSPITAL_COMMUNITY)
Admission: RE | Admit: 2013-09-30 | Discharge: 2013-09-30 | Disposition: A | Discharge status: Home / Self Care | Payer: Medicare Other | Source: Ambulatory Visit | Attending: Family Medicine | Admitting: Family Medicine

## 2013-09-30 ENCOUNTER — Other Ambulatory Visit: Payer: Self-pay | Admitting: Family Medicine

## 2013-09-30 DIAGNOSIS — I10 Essential (primary) hypertension: Secondary | ICD-10-CM

## 2013-09-30 DIAGNOSIS — R0989 Other specified symptoms and signs involving the circulatory and respiratory systems: Secondary | ICD-10-CM | POA: Insufficient documentation

## 2013-09-30 DIAGNOSIS — I658 Occlusion and stenosis of other precerebral arteries: Secondary | ICD-10-CM | POA: Insufficient documentation

## 2013-09-30 DIAGNOSIS — I6529 Occlusion and stenosis of unspecified carotid artery: Secondary | ICD-10-CM | POA: Insufficient documentation

## 2013-09-30 DIAGNOSIS — E119 Type 2 diabetes mellitus without complications: Secondary | ICD-10-CM

## 2013-09-30 DIAGNOSIS — N179 Acute kidney failure, unspecified: Secondary | ICD-10-CM

## 2013-10-01 ENCOUNTER — Other Ambulatory Visit: Payer: Self-pay | Admitting: Family Medicine

## 2013-10-01 ENCOUNTER — Ambulatory Visit (HOSPITAL_COMMUNITY)
Admission: RE | Admit: 2013-10-01 | Discharge: 2013-10-01 | Disposition: A | Discharge status: Home / Self Care | Payer: Medicare Other | Source: Ambulatory Visit | Attending: Family Medicine | Admitting: Family Medicine

## 2013-10-01 DIAGNOSIS — I10 Essential (primary) hypertension: Secondary | ICD-10-CM

## 2013-10-01 DIAGNOSIS — I6529 Occlusion and stenosis of unspecified carotid artery: Secondary | ICD-10-CM

## 2013-10-01 DIAGNOSIS — E119 Type 2 diabetes mellitus without complications: Secondary | ICD-10-CM | POA: Insufficient documentation

## 2013-10-01 DIAGNOSIS — N179 Acute kidney failure, unspecified: Secondary | ICD-10-CM

## 2013-10-01 DIAGNOSIS — N281 Cyst of kidney, acquired: Secondary | ICD-10-CM | POA: Insufficient documentation

## 2013-10-01 MED ORDER — SIMVASTATIN 5 MG PO TABS: 5.0000 mg | ORAL_TABLET | Freq: Every day | ORAL | Status: DC

## 2013-10-01 NOTE — Progress Notes (Signed)
Pt aware of results 

## 2013-10-06 ENCOUNTER — Other Ambulatory Visit: Payer: Self-pay | Admitting: *Deleted

## 2013-10-06 ENCOUNTER — Encounter: Payer: Self-pay | Admitting: Vascular Surgery

## 2013-10-06 DIAGNOSIS — I6529 Occlusion and stenosis of unspecified carotid artery: Secondary | ICD-10-CM

## 2013-11-03 ENCOUNTER — Encounter: Payer: Self-pay | Admitting: Vascular Surgery

## 2013-11-04 ENCOUNTER — Other Ambulatory Visit: Payer: Self-pay | Admitting: *Deleted

## 2013-11-04 ENCOUNTER — Encounter: Payer: Self-pay | Admitting: *Deleted

## 2013-11-04 ENCOUNTER — Ambulatory Visit (HOSPITAL_COMMUNITY)
Admission: RE | Admit: 2013-11-04 | Discharge: 2013-11-04 | Disposition: A | Discharge status: Home / Self Care | Payer: Medicare Other | Source: Ambulatory Visit | Attending: Vascular Surgery | Admitting: Vascular Surgery

## 2013-11-04 ENCOUNTER — Encounter: Payer: Self-pay | Admitting: Vascular Surgery

## 2013-11-04 ENCOUNTER — Ambulatory Visit (INDEPENDENT_AMBULATORY_CARE_PROVIDER_SITE_OTHER): Payer: Medicare Other | Admitting: Vascular Surgery

## 2013-11-04 VITALS — BP 138/61 | HR 89 | Ht 66.0 in | Wt 230.8 lb

## 2013-11-04 DIAGNOSIS — I6529 Occlusion and stenosis of unspecified carotid artery: Secondary | ICD-10-CM

## 2013-11-04 DIAGNOSIS — I658 Occlusion and stenosis of other precerebral arteries: Secondary | ICD-10-CM | POA: Insufficient documentation

## 2013-11-04 NOTE — Progress Notes (Signed)
[facility] [place] HISTORY AND PHYSICAL   History of Present Illness:  Patient is a 73 y.o. year old male who presents for evaluation of asymptomatic carotid stenosis. The patient was initially evaluated by his primary care physician where bruit was noted. The patient denies any symptoms of TIA amaurosis or stroke. He denies prior history of myocardial infarction. He is fairly limited exercise capacity due to COPD. He states that he cannot climb 2 flights of stairs. He also complains of increased numbness and tingling in both feet. He denies claudication. He denies history of nonhealing wounds on his feet..  Other medical problems include hypertension diabetes both of which are currently stable. These are followed by his primary care physician. He is a former smoker but quit several years ago.  Past Medical History  Diagnosis Date  . COPD (chronic obstructive pulmonary disease)   . Arthritis   . Melanoma   . Hypertension   . Diabetes mellitus without complication   . Carotid artery occlusion   . Peripheral arterial disease     Past Surgical History  Procedure Laterality Date  . Tonsillectomy      Social History History  Substance Use Topics  . Smoking status: Former Smoker    Quit date: 09/17/2007  . Smokeless tobacco: Not on file  . Alcohol Use: No    Family History History reviewed. No pertinent family history.  Allergies  No Known Allergies   Current Outpatient Prescriptions  Medication Sig Dispense Refill  . aspirin 81 MG tablet Take 81 mg by mouth daily.      . Fluticasone-Salmeterol (ADVAIR DISKUS) 250-50 MCG/DOSE AEPB Inhale 1 puff into the lungs 2 (two) times daily.  60 each  3  . hydrochlorothiazide (HYDRODIURIL) 25 MG tablet Take 1 tablet (25 mg total) by mouth daily.  30 tablet  3  . simvastatin (ZOCOR) 5 MG tablet Take 1 tablet (5 mg total) by mouth at bedtime.  30 tablet  3   No current facility-administered medications for this visit.     ROS:   General:  No weight loss, Fever, chills  HEENT: No recent headaches, no nasal bleeding, no visual changes, no sore throat  Neurologic: No dizziness, blackouts, seizures. No recent symptoms of stroke or mini- stroke. No recent episodes of slurred speech, or temporary blindness.  Cardiac: No recent episodes of chest pain/pressure, no shortness of breath at rest.  + shortness of breath with exertion.  Denies history of atrial fibrillation or irregular heartbeat  Vascular: No history of rest pain in feet.  No history of claudication.  No history of non-healing ulcer, No history of DVT   Pulmonary: No home oxygen, no productive cough, no hemoptysis,  No asthma or wheezing  Musculoskeletal:  [ ]  Arthritis, [ ]  Low back pain,  [ ]  Joint pain  Hematologic:No history of hypercoagulable state.  No history of easy bleeding.  No history of anemia  Gastrointestinal: No hematochezia or melena,  No gastroesophageal reflux, no trouble swallowing  Urinary: [ ]  chronic Kidney disease, [ ]  on HD - [ ]  MWF or [ ]  TTHS, [ ]  Burning with urination, [ ]  Frequent urination, [ ]  Difficulty urinating;   Skin: No rashes  Psychological: No history of anxiety,  No history of depression   Physical Examination  Filed Vitals:   11/04/13 1429 11/04/13 1431  BP: 140/56 138/61  Pulse: 89   Height: 5\' 6"  (1.676 m)   Weight: 230 lb 12.8 oz (104.69 kg)  SpO2: 97%     Body mass index is 37.27 kg/(m^2).  General:  Alert and oriented, no acute distress HEENT: Normal Neck: Bilateral carotid bruits, 2+ carotid pulse Pulmonary: Clear to auscultation bilaterally Cardiac: Regular Rate and Rhythm without murmur Abdomen: Soft, non-tender, non-distended, no mass Skin: No rash Extremity Pulses:  2+ radial, brachial, femoral, absent dorsalis pedis bilaterally, 2+ posterior tibial pulses bilaterally Musculoskeletal: No deformity or edema  Neurologic: Upper and lower extremity motor 5/5 and  symmetric  DATA:  Carotid duplex exam was performed today for operative planning purposes. This showed a 60-80% left internal carotid artery stenosis and a greater than 80% right internal carotid artery stenosis. Of note he had a high carotid bifurcation. This was compared to a recent carotid duplex exam performed in [place] which showed a high-grade right internal carotid artery stenosis as well.   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 Based on the arteriogram findings we'll consider carotid endarterectomy versus carotid stenting. We will also have the patient scheduled for a preoperative cardiology evaluation for cardiac risk stratification.  Ruta Hinds, MD [facility] [place] Office: [phone] Pager: [phone]

## 2013-11-05 ENCOUNTER — Ambulatory Visit (INDEPENDENT_AMBULATORY_CARE_PROVIDER_SITE_OTHER): Payer: Medicare Other | Admitting: Cardiology

## 2013-11-05 ENCOUNTER — Encounter: Payer: Self-pay | Admitting: Cardiology

## 2013-11-05 ENCOUNTER — Encounter (HOSPITAL_COMMUNITY): Payer: Self-pay | Admitting: Pharmacy Technician

## 2013-11-05 VITALS — BP 154/74 | HR 79 | Ht 66.0 in | Wt 231.0 lb

## 2013-11-05 DIAGNOSIS — I10 Essential (primary) hypertension: Secondary | ICD-10-CM

## 2013-11-05 DIAGNOSIS — I6529 Occlusion and stenosis of unspecified carotid artery: Secondary | ICD-10-CM

## 2013-11-05 DIAGNOSIS — Z01810 Encounter for preprocedural cardiovascular examination: Secondary | ICD-10-CM

## 2013-11-05 DIAGNOSIS — I739 Peripheral vascular disease, unspecified: Secondary | ICD-10-CM

## 2013-11-05 NOTE — Patient Instructions (Signed)
The current medical regimen is effective;  continue present plan and medications.  You have been cleared for surgery.  Follow up as needed.

## 2013-11-05 NOTE — Progress Notes (Signed)
HPI The patient presents for preoperative evaluation. He has no prior cardiac history though he reports a stress test many years ago. He is being evaluated for carotid endarterectomy. He is scheduled to have surgery. He denies any chest pressure, neck or arm discomfort. He does not have palpitations, presyncope or syncope. He does not have PND or orthopnea. He does have a long-standing history of tobacco abuse quitting in 2009. He has some chronic lung disease with this and is somewhat limited. He's also limited by back pain. However he denies any cardiovascular symptoms activities he can and she which includes walking a flight of stairs.  No Known Allergies  Current Outpatient Prescriptions  Medication Sig Dispense Refill  . aspirin 81 MG tablet Take 81 mg by mouth daily.      . Fluticasone-Salmeterol (ADVAIR) 250-50 MCG/DOSE AEPB Inhale 1 puff into the lungs as needed.      . hydrochlorothiazide (HYDRODIURIL) 25 MG tablet Take 1 tablet (25 mg total) by mouth daily.  30 tablet  3  . simvastatin (ZOCOR) 5 MG tablet Take 1 tablet (5 mg total) by mouth at bedtime.  30 tablet  3   No current facility-administered medications for this visit.    Past Medical History  Diagnosis Date  . COPD (chronic obstructive pulmonary disease)   . Arthritis   . Melanoma   . Hypertension   . Diabetes mellitus without complication   . Carotid artery occlusion     Past Surgical History  Procedure Laterality Date  . Tonsillectomy      Family History  Problem Relation Age of Onset  . Dementia Father     History   Social History  . Marital Status: Divorced    Spouse Name: N/A    Number of Children: 93  . Years of Education: N/A   Occupational History  . Not on file.   Social History Main Topics  . Smoking status: Former Smoker -- 3.00 packs/day for 50 years    Types: Cigarettes    Quit date: 09/17/2007  . Smokeless tobacco: Not on file  . Alcohol Use: No  . Drug Use: No  . Sexual  Activity: Not on file   Other Topics Concern  . Not on file   Social History Narrative  . No narrative on file    ROS:  As stated in the HPI and negative for all other systems.  PHYSICAL EXAM BP 154/74  Pulse 79  Ht 5\' 6"  (1.676 m)  Wt 231 lb (104.781 kg)  BMI 37.30 kg/m2 GENERAL:  Well appearing HEENT:  Pupils equal round and reactive, fundi not visualized, oral mucosa unremarkable NECK:  No jugular venous distention, waveform within normal limits, carotid upstroke brisk and symmetric, soft right bruit, loud left bruit, no thyromegaly LYMPHATICS:  No cervical, inguinal adenopathy LUNGS:  Clear to auscultation bilaterally BACK:  No CVA tenderness CHEST:  Unremarkable HEART:  PMI not displaced or sustained,S1 and S2 within normal limits, no S3, no S4, no clicks, no rubs, no murmurs ABD:  Flat, positive bowel sounds normal in frequency in pitch, no bruits, no rebound, no guarding, no midline pulsatile mass, no hepatomegaly, no splenomegaly EXT:  2 plus pulses upper and mildly diminished DP/PT bilateral, no edema, no cyanosis no clubbing SKIN:  No rashes no nodules NEURO:  Cranial nerves II through XII grossly intact, motor grossly intact throughout PSYCH:  Cognitively intact, oriented to person place and time   EKG:  Sinus rhythm, rate 79, left axis  deviation, intervals within normal limits, no acute ST-T wave changes.  11/05/2013    ASSESSMENT AND PLAN  PREOP:  The patient is not going for a high risk procedure. He has a functional level greater than 4 METS.  There are no high-risk features or findings. Given this, according to 2014 ACC/AHA guidelines the patient is at acceptable risk for the planned surgery without further cardiovascular testing. He does need aggressive risk reduction.  HTN:  His blood pressure is elevated today but this is unusual. I reviewed previous readings and they have been within target range. He will continue on the meds as listed.  DYSLIPIDEMIA:  His  LDL is 74. He can continue on the meds as listed.  DIABETES.  Hemoglobin A1c was 6.7. We did talk about diet in addition to medical therapy. I gave him specific recommendations.

## 2013-11-08 MED ORDER — SODIUM CHLORIDE 0.9 % IV SOLN
INTRAVENOUS | Status: DC
  Administered 2013-11-09: 11:00:00 via INTRAVENOUS
  Filled 2013-11-08: qty 1000

## 2013-11-09 ENCOUNTER — Ambulatory Visit (HOSPITAL_COMMUNITY)
Admission: RE | Admit: 2013-11-09 | Discharge: 2013-11-09 | Disposition: A | Discharge status: Home / Self Care | Payer: Medicare Other | Source: Ambulatory Visit | Attending: Surgery | Admitting: Surgery

## 2013-11-09 ENCOUNTER — Telehealth: Payer: Self-pay | Admitting: Vascular Surgery

## 2013-11-09 ENCOUNTER — Encounter (HOSPITAL_COMMUNITY)
Admission: RE | Disposition: A | Discharge status: Home / Self Care | Payer: Self-pay | Source: Ambulatory Visit | Attending: Surgery

## 2013-11-09 DIAGNOSIS — J4489 Other specified chronic obstructive pulmonary disease: Secondary | ICD-10-CM | POA: Insufficient documentation

## 2013-11-09 DIAGNOSIS — I658 Occlusion and stenosis of other precerebral arteries: Secondary | ICD-10-CM | POA: Insufficient documentation

## 2013-11-09 DIAGNOSIS — I6529 Occlusion and stenosis of unspecified carotid artery: Secondary | ICD-10-CM | POA: Insufficient documentation

## 2013-11-09 DIAGNOSIS — E119 Type 2 diabetes mellitus without complications: Secondary | ICD-10-CM | POA: Insufficient documentation

## 2013-11-09 DIAGNOSIS — Z7982 Long term (current) use of aspirin: Secondary | ICD-10-CM | POA: Insufficient documentation

## 2013-11-09 DIAGNOSIS — J449 Chronic obstructive pulmonary disease, unspecified: Secondary | ICD-10-CM | POA: Insufficient documentation

## 2013-11-09 DIAGNOSIS — Z87891 Personal history of nicotine dependence: Secondary | ICD-10-CM | POA: Insufficient documentation

## 2013-11-09 HISTORY — PX: CAROTID ANGIOGRAM: SHX5504

## 2013-11-09 LAB — POCT I-STAT, CHEM 8
BUN: 21 mg/dL (ref 6–23)
Calcium, Ion: 1.2 mmol/L (ref 1.13–1.30)
Chloride: 102 mEq/L (ref 96–112)
Creatinine, Ser: 1.8 mg/dL — ABNORMAL HIGH (ref 0.50–1.35)
Glucose, Bld: 121 mg/dL — ABNORMAL HIGH (ref 70–99)
HEMATOCRIT: 48 % (ref 39.0–52.0)
Hemoglobin: 16.3 g/dL (ref 13.0–17.0)
POTASSIUM: 3.8 meq/L (ref 3.7–5.3)
SODIUM: 140 meq/L (ref 137–147)
TCO2: 25 mmol/L (ref 0–100)

## 2013-11-09 LAB — GLUCOSE, CAPILLARY: Glucose-Capillary: 83 mg/dL (ref 70–99)

## 2013-11-09 SURGERY — CAROTID ANGIOGRAM
Anesthesia: LOCAL | Laterality: Bilateral

## 2013-11-09 MED ORDER — ACETAMINOPHEN 325 MG RE SUPP
325.0000 mg | RECTAL | Status: DC | PRN
  Filled 2013-11-09: qty 2

## 2013-11-09 MED ORDER — ALUM & MAG HYDROXIDE-SIMETH 200-200-20 MG/5ML PO SUSP
15.0000 mL | ORAL | Status: DC | PRN
  Filled 2013-11-09: qty 30

## 2013-11-09 MED ORDER — LABETALOL HCL 5 MG/ML IV SOLN: 10.0000 mg | INTRAVENOUS | Status: DC | PRN

## 2013-11-09 MED ORDER — HYDRALAZINE HCL 20 MG/ML IJ SOLN: 10.0000 mg | INTRAMUSCULAR | Status: DC | PRN

## 2013-11-09 MED ORDER — METOPROLOL TARTRATE 1 MG/ML IV SOLN: 2.0000 mg | INTRAVENOUS | Status: DC | PRN

## 2013-11-09 MED ORDER — SODIUM CHLORIDE 0.9 % IV SOLN: INTRAVENOUS | Status: DC

## 2013-11-09 MED ORDER — GUAIFENESIN-DM 100-10 MG/5ML PO SYRP
15.0000 mL | ORAL_SOLUTION | ORAL | Status: DC | PRN
  Filled 2013-11-09: qty 15

## 2013-11-09 MED ORDER — ACETAMINOPHEN 325 MG PO TABS
325.0000 mg | ORAL_TABLET | ORAL | Status: DC | PRN
  Filled 2013-11-09: qty 2

## 2013-11-09 MED ORDER — OXYCODONE HCL 5 MG PO TABS: 5.0000 mg | ORAL_TABLET | ORAL | Status: DC | PRN

## 2013-11-09 MED ORDER — ONDANSETRON HCL 4 MG/2ML IJ SOLN: 4.0000 mg | Freq: Four times a day (QID) | INTRAMUSCULAR | Status: DC | PRN

## 2013-11-09 MED ORDER — MORPHINE SULFATE 10 MG/ML IJ SOLN: 2.0000 mg | INTRAMUSCULAR | Status: DC | PRN

## 2013-11-09 MED ORDER — PHENOL 1.4 % MT LIQD: 1.0000 | OROMUCOSAL | Status: DC | PRN

## 2013-11-09 NOTE — H&P (View-Only) (Signed)
[facility] [place] HISTORY AND PHYSICAL   History of Present Illness:  Patient is a 73 y.o. year old male who presents for evaluation of asymptomatic carotid stenosis. The patient was initially evaluated by his primary care physician where bruit was noted. The patient denies any symptoms of TIA amaurosis or stroke. He denies prior history of myocardial infarction. He is fairly limited exercise capacity due to COPD. He states that he cannot climb 2 flights of stairs. He also complains of increased numbness and tingling in both feet. He denies claudication. He denies history of nonhealing wounds on his feet..  Other medical problems include hypertension diabetes both of which are currently stable. These are followed by his primary care physician. He is a former smoker but quit several years ago.  Past Medical History  Diagnosis Date  . COPD (chronic obstructive pulmonary disease)   . Arthritis   . Melanoma   . Hypertension   . Diabetes mellitus without complication   . Carotid artery occlusion   . Peripheral arterial disease     Past Surgical History  Procedure Laterality Date  . Tonsillectomy      Social History History  Substance Use Topics  . Smoking status: Former Smoker    Quit date: 09/17/2007  . Smokeless tobacco: Not on file  . Alcohol Use: No    Family History History reviewed. No pertinent family history.  Allergies  No Known Allergies   Current Outpatient Prescriptions  Medication Sig Dispense Refill  . aspirin 81 MG tablet Take 81 mg by mouth daily.      . Fluticasone-Salmeterol (ADVAIR DISKUS) 250-50 MCG/DOSE AEPB Inhale 1 puff into the lungs 2 (two) times daily.  60 each  3  . hydrochlorothiazide (HYDRODIURIL) 25 MG tablet Take 1 tablet (25 mg total) by mouth daily.  30 tablet  3  . simvastatin (ZOCOR) 5 MG tablet Take 1 tablet (5 mg total) by mouth at bedtime.  30 tablet  3   No current facility-administered medications for this visit.     ROS:   General:  No weight loss, Fever, chills  HEENT: No recent headaches, no nasal bleeding, no visual changes, no sore throat  Neurologic: No dizziness, blackouts, seizures. No recent symptoms of stroke or mini- stroke. No recent episodes of slurred speech, or temporary blindness.  Cardiac: No recent episodes of chest pain/pressure, no shortness of breath at rest.  + shortness of breath with exertion.  Denies history of atrial fibrillation or irregular heartbeat  Vascular: No history of rest pain in feet.  No history of claudication.  No history of non-healing ulcer, No history of DVT   Pulmonary: No home oxygen, no productive cough, no hemoptysis,  No asthma or wheezing  Musculoskeletal:  [ ]  Arthritis, [ ]  Low back pain,  [ ]  Joint pain  Hematologic:No history of hypercoagulable state.  No history of easy bleeding.  No history of anemia  Gastrointestinal: No hematochezia or melena,  No gastroesophageal reflux, no trouble swallowing  Urinary: [ ]  chronic Kidney disease, [ ]  on HD - [ ]  MWF or [ ]  TTHS, [ ]  Burning with urination, [ ]  Frequent urination, [ ]  Difficulty urinating;   Skin: No rashes  Psychological: No history of anxiety,  No history of depression   Physical Examination  Filed Vitals:   11/04/13 1429 11/04/13 1431  BP: 140/56 138/61  Pulse: 89   Height: 5\' 6"  (1.676 m)   Weight: 230 lb 12.8 oz (104.69 kg)  SpO2: 97%     Body mass index is 37.27 kg/(m^2).  General:  Alert and oriented, no acute distress HEENT: Normal Neck: Bilateral carotid bruits, 2+ carotid pulse Pulmonary: Clear to auscultation bilaterally Cardiac: Regular Rate and Rhythm without murmur Abdomen: Soft, non-tender, non-distended, no mass Skin: No rash Extremity Pulses:  2+ radial, brachial, femoral, absent dorsalis pedis bilaterally, 2+ posterior tibial pulses bilaterally Musculoskeletal: No deformity or edema  Neurologic: Upper and lower extremity motor 5/5 and  symmetric  DATA:  Carotid duplex exam was performed today for operative planning purposes. This showed a 60-80% left internal carotid artery stenosis and a greater than 80% right internal carotid artery stenosis. Of note he had a high carotid bifurcation. This was compared to a recent carotid duplex exam performed in [place] which showed a high-grade right internal carotid artery stenosis as well.   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 Based on the arteriogram findings we'll consider carotid endarterectomy versus carotid stenting. We will also have the patient scheduled for a preoperative cardiology evaluation for cardiac risk stratification.  Ruta Hinds, MD [facility] [place] Office: [phone] Pager: [phone]

## 2013-11-09 NOTE — Progress Notes (Signed)
Discharge instruction given per MD order.  Pt and CG able to verbalize understanding.  Pt to car via wheelchair.

## 2013-11-09 NOTE — Discharge Instructions (Signed)
Angiography, Care After °Refer to this sheet in the next few weeks. These instructions provide you with information on caring for yourself after your procedure. Your health care provider may also give you more specific instructions. Your treatment has been planned according to current medical practices, but problems sometimes occur. Call your health care provider if you have any problems or questions after your procedure.  °WHAT TO EXPECT AFTER THE PROCEDURE °After your procedure, it is typical to have the following sensations: °· Minor discomfort or tenderness and a small bump at the catheter insertion site. The bump should usually decrease in size and tenderness within 1 to 2 weeks. °· Any bruising will usually fade within 2 to 4 weeks. °HOME CARE INSTRUCTIONS  °· You may need to keep taking blood thinners if they were prescribed for you. Only take over-the-counter or prescription medicines for pain, fever, or discomfort as directed by your health care provider. °· Do not apply powder or lotion to the site. °· Do not sit in a bathtub, swimming pool, or whirlpool for 5 to 7 days. °· You may shower 24 hours after the procedure. Remove the bandage (dressing) and gently wash the site with plain soap and water. Gently pat the site dry. °· Inspect the site at least twice daily. °· Limit your activity for the first 24 hours. Do not bend, squat, or lift anything over 10 lb or as directed by your health care provider. °· Do not drive home if you are discharged the day of the procedure. Have someone else drive you. Follow instructions about when you can drive or return to work. °SEEK MEDICAL CARE IF: °· You get lightheaded when standing up. °· You have drainage (other than a small amount of blood on the dressing). °· You have chills. °· You have a fever. °· You have redness, warmth, swelling, or pain at the insertion site. °SEEK IMMEDIATE MEDICAL CARE IF:  °· You develop chest pain or shortness of breath, feel faint, or  pass out. °· You have bleeding, swelling larger than a walnut, or drainage from the catheter insertion site. °· You develop pain, discoloration, coldness, or severe bruising in the leg or arm that held the catheter. °· You have heavy bleeding from the site. If this happens, hold pressure on the site. °MAKE SURE YOU: °· Understand these instructions. °· Will watch your condition. °· Will get help right away if you are not doing well or get worse. °Document Released: 03/21/2005 Document Revised: 05/05/2013 Document Reviewed: 01/25/2013 °Exit[employer]® Patient Information ©2014 [employer]. ° °

## 2013-11-09 NOTE — Op Note (Signed)
    Patient name: Todd Poole MRN: [id number] DOB: [birth year] Sex: male  11/09/2013 Pre-operative Diagnosis: Carotid stenosis, bilateral Post-operative diagnosis:  Same Surgeon:  Eldridge Abrahams Procedure Performed:  1.  ultrasound-guided access, right femoral artery  2. aortic arch angiogram  3.  second order catheterization (right common carotid artery)  4.  right carotid artery angiogram  5.  first order catheterization (left common carotid artery)  6.  left carotid angiogram     Indications:  The patient was found to have high-grade bilateral carotid stenosis, right greater than left.  Ultrasound suggests a high bifurcation.  He comes in today for further evaluation of his anatomy.  Procedure:  The patient was identified in the holding area and taken to room 8.  The patient was then placed supine on the table and prepped and draped in the usual sterile fashion.  A time out was called.  Ultrasound was used to evaluate the right common femoral artery.  It was patent .  A digital ultrasound image was acquired.  A micropuncture needle was used to access the right common femoral artery under ultrasound guidance.  An 018 wire was advanced without resistance and a micropuncture sheath was placed.  The 018 wire was removed and a benson wire was placed.  The micropuncture sheath was exchanged for a 5 french sheath.  A pigtail catheter was advanced into the descending aorta and an aortic arch angiogram was performed.  Next using a Berenstein 2 catheter, the innominate artery was selected and then the right common carotid artery was selected with the catheter was placed for right carotid angiogram.  Next, the Berenstein 2 catheter was used to select the left common carotid artery and a left carotid antrum was performed.  Findings:   Aortogram:  A type I aortic arch is identified.  No significant ostial stenosis is identified within the innominate, left carotid, and left subclavian  artery.  Right carotid artery:  The proximal right common carotid artery is widely patent.  The right external carotid artery is widely patent.  There is approximately a 95% stenosis within the right internal carotid artery.  The lesion is high, from an anatomically accessible perspective.  Left Lower Extremity:  The left common carotid artery is widely patent the left external carotid artery is widely patent.  There is approximately 40% stenosis within the left internal carotid artery.   Impression:  #1  type I aortic arch  #2  95% stenosis, right carotid artery.  The bifurcation is very high.  #3  40% stenosis left internal carotid artery    V. Annamarie Major, M.D. [facility] [place] Office: [phone] Pager:  [phone]

## 2013-11-09 NOTE — Telephone Encounter (Addendum)
Message copied by Gena Fray on Tue Nov 09, 2013  4:08 PM ------      Message from: Mena Goes      Created: Tue Nov 09, 2013  1:31 PM      Regarding: Schedule       Dr. Oneida Alar appt for next week please.            ----- Message -----         From: Serafina Mitchell, MD         Sent: 11/09/2013   1:17 PM           To: [facility] Charge Pool            11/09/2013:            Surgeon:  Eldridge Abrahams      Procedure Performed:       1.  ultrasound-guided access, right femoral artery       2. aortic arch angiogram       3.  second order catheterization (right common carotid artery)       4.  right carotid artery angiogram       5.  first order catheterization (left common carotid artery)       6.  left carotid angiogram                  Schedule followup in the office for Dr. fields next week       ------  11/09/13: spoke with pt to schedule appointment, dpm

## 2013-11-09 NOTE — Interval H&P Note (Signed)
History and Physical Interval Note:  11/09/2013 10:14 AM  Todd Poole  has presented today for surgery, with the diagnosis of carotid stenosis  The various methods of treatment have been discussed with the patient and family. After consideration of risks, benefits and other options for treatment, the patient has consented to  Procedure(s): CAROTID ANGIOGRAM (N/A) as a surgical intervention .  The patient's history has been reviewed, patient examined, no change in status, stable for surgery.  I have reviewed the patient's chart and labs.  Questions were answered to the patient's satisfaction.     Turhan Chill IV, V. WELLS

## 2013-11-17 ENCOUNTER — Encounter: Payer: Self-pay | Admitting: Vascular Surgery

## 2013-11-18 ENCOUNTER — Encounter: Payer: Self-pay | Admitting: Vascular Surgery

## 2013-11-18 ENCOUNTER — Ambulatory Visit (INDEPENDENT_AMBULATORY_CARE_PROVIDER_SITE_OTHER): Payer: Medicare Other | Admitting: Vascular Surgery

## 2013-11-18 VITALS — BP 133/69 | HR 92 | Temp 98.5°F | Ht 66.0 in | Wt 226.0 lb

## 2013-11-18 DIAGNOSIS — I6529 Occlusion and stenosis of unspecified carotid artery: Secondary | ICD-10-CM

## 2013-11-18 MED ORDER — CLOPIDOGREL BISULFATE 75 MG PO TABS: 75.0000 mg | ORAL_TABLET | Freq: Every day | ORAL | Status: DC

## 2013-11-18 NOTE — Progress Notes (Signed)
[facility] [place] HISTORY AND PHYSICAL    History of Present Illness:  Patient is a 73 y.o. year old male who presents for evaluation of asymptomatic carotid stenosis. The patient was initially evaluated by his primary care physician where bruit was noted. The patient denies any symptoms of TIA amaurosis or stroke. He denies prior history of myocardial infarction. He is fairly limited exercise capacity due to COPD. He states that he cannot climb 2 flights of stairs. He also complains of increased numbness and tingling in both feet. He denies claudication. He denies history of nonhealing wounds on his feet..  Other medical problems include hypertension diabetes both of which are currently stable. These are followed by his primary care physician. He is a former smoker but quit several years ago.  Recent carotid arteriogram is reviewed. He has a high-grade greater than 80% stenosis of the right internal carotid artery the lesion is fairly high in the neck above the mandible with some tortuosity.    Past Medical History   Diagnosis  Date   .  COPD (chronic obstructive pulmonary disease)     .  Arthritis     .  Melanoma     .  Hypertension     .  Diabetes mellitus without complication     .  Carotid artery occlusion     .  Peripheral arterial disease         Past Surgical History   Procedure  Laterality  Date   .  Tonsillectomy         Social History History   Substance Use Topics   .  Smoking status:  Former Smoker       Quit date:  09/17/2007   .  Smokeless tobacco:  Not on file   .  Alcohol Use:  No     Family History History reviewed. No pertinent family history.  Allergies  No Known Allergies     Current Outpatient Prescriptions   Medication  Sig  Dispense  Refill   .  aspirin 81 MG tablet  Take 81 mg by mouth daily.         .  Fluticasone-Salmeterol (ADVAIR DISKUS) 250-50 MCG/DOSE AEPB  Inhale 1 puff into the lungs 2 (two) times daily.   60 each   3    .  hydrochlorothiazide (HYDRODIURIL) 25 MG tablet  Take 1 tablet (25 mg total) by mouth daily.   30 tablet   3   .  simvastatin (ZOCOR) 5 MG tablet  Take 1 tablet (5 mg total) by mouth at bedtime.   30 tablet   3      No current facility-administered medications for this visit.     ROS:    General:  No weight loss, Fever, chills  HEENT: No recent headaches, no nasal bleeding, no visual changes, no sore throat  Neurologic: No dizziness, blackouts, seizures. No recent symptoms of stroke or mini- stroke. No recent episodes of slurred speech, or temporary blindness.  Cardiac: No recent episodes of chest pain/pressure, no shortness of breath at rest.  + shortness of breath with exertion.  Denies history of atrial fibrillation or irregular heartbeat  Vascular: No history of rest pain in feet.  No history of claudication.  No history of non-healing ulcer, No history of DVT    Pulmonary: No home oxygen, no productive cough, no hemoptysis,  No asthma or wheezing  Musculoskeletal:  [ ]  Arthritis, [ ]  Low back pain,  [ ]   Joint pain  Hematologic:No history of hypercoagulable state.  No history of easy bleeding.  No history of anemia  Gastrointestinal: No hematochezia or melena,  No gastroesophageal reflux, no trouble swallowing  Urinary: [ ]  chronic Kidney disease, [ ]  on HD - [ ]  MWF or [ ]  TTHS, [ ]  Burning with urination, [ ]  Frequent urination, [ ]  Difficulty urinating;    Skin: No rashes  Psychological: No history of anxiety,  No history of depression   Physical Examination     Filed Vitals:   11/18/13 0929 11/18/13 0930  BP: 154/69 133/69  Pulse: 92   Temp: 98.5 F (36.9 C)   TempSrc: Oral   Height: 5\' 6"  (1.676 m)   Weight: 226 lb (102.513 kg)   SpO2: 98%    General:  Alert and oriented, no acute distress HEENT: Normal Neck: Bilateral carotid bruits, 2+ carotid pulse Neurologic: Upper and lower extremity motor 5/5 and symmetric  ASSESSMENT:  Asymptomatic high-grade  right internal carotid artery stenosis. High anatomic lesion needs right carotid stenting for stroke prophylaxis.  PLAN:  Risks benefits possible complications and procedure details the carotid stenting were trying the patient today. I did explain him that there is some tortuosity of the lesion and we may not be able to get a stent to turn in this corner. Risk of stroke and other complications of carotid stenting were also explained the patient today. Also defined and the risks benefits possible complications of carotid endarterectomy. I also explained that the lesion is high enough that with a carotid endarterectomy he would be at significant risk of cranial nerve injury. He understands his risk of stroke is slightly higher with stenting and the risk of cranial nerve injury is higher with carotid endarterectomy. If that's the case and we would proceed carotid endarterectomy the following week. He was started on Plavix today. His carotid stent is scheduled for March 20.  Ruta Hinds, MD [facility] [place] Office: [phone] Pager: [phone]

## 2013-11-19 ENCOUNTER — Other Ambulatory Visit: Payer: Self-pay

## 2013-11-19 ENCOUNTER — Encounter (HOSPITAL_COMMUNITY): Payer: Self-pay | Admitting: Pharmacy Technician

## 2013-12-02 MED ORDER — SODIUM CHLORIDE 0.9 % IV SOLN
INTRAVENOUS | Status: DC
  Administered 2013-12-03: 06:00:00 via INTRAVENOUS

## 2013-12-03 ENCOUNTER — Inpatient Hospital Stay (HOSPITAL_COMMUNITY)
Admission: RE | Admit: 2013-12-03 | Discharge: 2013-12-04 | DRG: 036 | Disposition: A | Discharge status: Home / Self Care | Payer: Medicare Other | Source: Ambulatory Visit | Attending: Vascular Surgery | Admitting: Vascular Surgery

## 2013-12-03 ENCOUNTER — Encounter (HOSPITAL_COMMUNITY): Payer: Self-pay | Admitting: *Deleted

## 2013-12-03 ENCOUNTER — Encounter (HOSPITAL_COMMUNITY)
Admission: RE | Disposition: A | Discharge status: Home / Self Care | Payer: Medicare Other | Source: Ambulatory Visit | Attending: Vascular Surgery

## 2013-12-03 DIAGNOSIS — E669 Obesity, unspecified: Secondary | ICD-10-CM | POA: Diagnosis present

## 2013-12-03 DIAGNOSIS — I10 Essential (primary) hypertension: Secondary | ICD-10-CM | POA: Diagnosis present

## 2013-12-03 DIAGNOSIS — Z85820 Personal history of malignant melanoma of skin: Secondary | ICD-10-CM

## 2013-12-03 DIAGNOSIS — E119 Type 2 diabetes mellitus without complications: Secondary | ICD-10-CM | POA: Diagnosis present

## 2013-12-03 DIAGNOSIS — J4489 Other specified chronic obstructive pulmonary disease: Secondary | ICD-10-CM | POA: Diagnosis present

## 2013-12-03 DIAGNOSIS — Z006 Encounter for examination for normal comparison and control in clinical research program: Secondary | ICD-10-CM

## 2013-12-03 DIAGNOSIS — Z7982 Long term (current) use of aspirin: Secondary | ICD-10-CM

## 2013-12-03 DIAGNOSIS — Z79899 Other long term (current) drug therapy: Secondary | ICD-10-CM

## 2013-12-03 DIAGNOSIS — I6529 Occlusion and stenosis of unspecified carotid artery: Principal | ICD-10-CM | POA: Diagnosis present

## 2013-12-03 DIAGNOSIS — Z87891 Personal history of nicotine dependence: Secondary | ICD-10-CM

## 2013-12-03 DIAGNOSIS — J449 Chronic obstructive pulmonary disease, unspecified: Secondary | ICD-10-CM | POA: Diagnosis present

## 2013-12-03 HISTORY — PX: CAROTID STENT: SHX1301

## 2013-12-03 HISTORY — PX: CAROTID STENT INSERTION: SHX5505

## 2013-12-03 LAB — POCT I-STAT, CHEM 8
BUN: 18 mg/dL (ref 6–23)
CALCIUM ION: 1.16 mmol/L (ref 1.13–1.30)
Chloride: 100 mEq/L (ref 96–112)
Creatinine, Ser: 1.8 mg/dL — ABNORMAL HIGH (ref 0.50–1.35)
Glucose, Bld: 126 mg/dL — ABNORMAL HIGH (ref 70–99)
HEMATOCRIT: 45 % (ref 39.0–52.0)
Hemoglobin: 15.3 g/dL (ref 13.0–17.0)
Potassium: 3.4 mEq/L — ABNORMAL LOW (ref 3.7–5.3)
Sodium: 141 mEq/L (ref 137–147)
TCO2: 27 mmol/L (ref 0–100)

## 2013-12-03 LAB — GLUCOSE, CAPILLARY
Glucose-Capillary: 107 mg/dL — ABNORMAL HIGH (ref 70–99)
Glucose-Capillary: 115 mg/dL — ABNORMAL HIGH (ref 70–99)
Glucose-Capillary: 133 mg/dL — ABNORMAL HIGH (ref 70–99)

## 2013-12-03 LAB — POCT ACTIVATED CLOTTING TIME: Activated Clotting Time: 382 seconds

## 2013-12-03 LAB — MRSA PCR SCREENING: MRSA by PCR: NEGATIVE

## 2013-12-03 SURGERY — CAROTID STENT INSERTION
Anesthesia: LOCAL | Laterality: Right

## 2013-12-03 MED ORDER — GUAIFENESIN-DM 100-10 MG/5ML PO SYRP: 15.0000 mL | ORAL_SOLUTION | ORAL | Status: DC | PRN

## 2013-12-03 MED ORDER — HEPARIN (PORCINE) IN NACL 2-0.9 UNIT/ML-% IJ SOLN
INTRAMUSCULAR | Status: AC
  Filled 2013-12-03: qty 1000

## 2013-12-03 MED ORDER — MORPHINE SULFATE 10 MG/ML IJ SOLN: 2.0000 mg | INTRAMUSCULAR | Status: DC | PRN

## 2013-12-03 MED ORDER — ACETAMINOPHEN 325 MG RE SUPP
325.0000 mg | RECTAL | Status: DC | PRN
  Filled 2013-12-03: qty 2

## 2013-12-03 MED ORDER — ACETAMINOPHEN 325 MG PO TABS: 325.0000 mg | ORAL_TABLET | ORAL | Status: DC | PRN

## 2013-12-03 MED ORDER — LIDOCAINE HCL (PF) 1 % IJ SOLN
INTRAMUSCULAR | Status: AC
  Filled 2013-12-03: qty 30

## 2013-12-03 MED ORDER — OXYCODONE HCL 5 MG PO TABS: 5.0000 mg | ORAL_TABLET | ORAL | Status: DC | PRN

## 2013-12-03 MED ORDER — NOREPINEPHRINE BITARTRATE 1 MG/ML IJ SOLN
INTRAMUSCULAR | Status: AC
  Filled 2013-12-03: qty 4

## 2013-12-03 MED ORDER — METOPROLOL TARTRATE 1 MG/ML IV SOLN: 2.0000 mg | INTRAVENOUS | Status: DC | PRN

## 2013-12-03 MED ORDER — LABETALOL HCL 5 MG/ML IV SOLN: 10.0000 mg | INTRAVENOUS | Status: DC | PRN

## 2013-12-03 MED ORDER — CLOPIDOGREL BISULFATE 75 MG PO TABS
75.0000 mg | ORAL_TABLET | Freq: Once | ORAL | Status: DC
Start: 2013-12-03 — End: 2013-12-03

## 2013-12-03 MED ORDER — DEXTROSE 5 % IV SOLN
1.5000 g | Freq: Two times a day (BID) | INTRAVENOUS | Status: AC
  Administered 2013-12-03 – 2013-12-04 (×2): 1.5 g via INTRAVENOUS
  Filled 2013-12-03 (×2): qty 1.5

## 2013-12-03 MED ORDER — BIVALIRUDIN 250 MG IV SOLR
INTRAVENOUS | Status: AC
  Filled 2013-12-03: qty 250

## 2013-12-03 MED ORDER — INSULIN ASPART 100 UNIT/ML ~~LOC~~ SOLN: 0.0000 [IU] | Freq: Three times a day (TID) | SUBCUTANEOUS | Status: DC

## 2013-12-03 MED ORDER — ALUM & MAG HYDROXIDE-SIMETH 200-200-20 MG/5ML PO SUSP: 15.0000 mL | ORAL | Status: DC | PRN

## 2013-12-03 MED ORDER — CLOPIDOGREL BISULFATE 75 MG PO TABS
75.0000 mg | ORAL_TABLET | Freq: Once | ORAL | Status: AC
  Administered 2013-12-03: 75 mg via ORAL
  Filled 2013-12-03: qty 1

## 2013-12-03 MED ORDER — POTASSIUM CHLORIDE CRYS ER 20 MEQ PO TBCR: 20.0000 meq | EXTENDED_RELEASE_TABLET | Freq: Every day | ORAL | Status: DC | PRN

## 2013-12-03 MED ORDER — ATROPINE SULFATE 0.1 MG/ML IJ SOLN
INTRAMUSCULAR | Status: AC
  Filled 2013-12-03: qty 10

## 2013-12-03 MED ORDER — DOCUSATE SODIUM 100 MG PO CAPS
100.0000 mg | ORAL_CAPSULE | Freq: Every day | ORAL | Status: DC
  Administered 2013-12-04: 100 mg via ORAL
  Filled 2013-12-03: qty 1

## 2013-12-03 MED ORDER — MORPHINE SULFATE 2 MG/ML IJ SOLN: 2.0000 mg | INTRAMUSCULAR | Status: DC | PRN

## 2013-12-03 MED ORDER — SODIUM CHLORIDE 0.45 % IV SOLN
INTRAVENOUS | Status: DC
  Administered 2013-12-03: 09:00:00 via INTRAVENOUS

## 2013-12-03 MED ORDER — DOPAMINE-DEXTROSE 3.2-5 MG/ML-% IV SOLN: 3.0000 ug/kg/min | INTRAVENOUS | Status: DC

## 2013-12-03 MED ORDER — CLOPIDOGREL BISULFATE 75 MG PO TABS
75.0000 mg | ORAL_TABLET | Freq: Every day | ORAL | Status: DC
  Administered 2013-12-04: 75 mg via ORAL
  Filled 2013-12-03 (×2): qty 1

## 2013-12-03 MED ORDER — HYDRALAZINE HCL 20 MG/ML IJ SOLN: 10.0000 mg | INTRAMUSCULAR | Status: DC | PRN

## 2013-12-03 MED ORDER — SODIUM CHLORIDE 0.9 % IV SOLN: 500.0000 mL | Freq: Once | INTRAVENOUS | Status: AC | PRN

## 2013-12-03 MED ORDER — PANTOPRAZOLE SODIUM 40 MG PO TBEC
40.0000 mg | DELAYED_RELEASE_TABLET | Freq: Every day | ORAL | Status: DC
  Administered 2013-12-03 – 2013-12-04 (×2): 40 mg via ORAL
  Filled 2013-12-03 (×2): qty 1

## 2013-12-03 MED ORDER — MAGNESIUM SULFATE 40 MG/ML IJ SOLN: 2.0000 g | Freq: Every day | INTRAMUSCULAR | Status: DC | PRN

## 2013-12-03 MED ORDER — ONDANSETRON HCL 4 MG/2ML IJ SOLN: 4.0000 mg | Freq: Four times a day (QID) | INTRAMUSCULAR | Status: DC | PRN

## 2013-12-03 MED ORDER — ASPIRIN 325 MG PO TABS
325.0000 mg | ORAL_TABLET | Freq: Every day | ORAL | Status: DC
  Administered 2013-12-03 – 2013-12-04 (×2): 325 mg via ORAL
  Filled 2013-12-03 (×2): qty 1

## 2013-12-03 MED ORDER — PHENOL 1.4 % MT LIQD: 1.0000 | OROMUCOSAL | Status: DC | PRN

## 2013-12-03 NOTE — Progress Notes (Signed)
Utilization review completed.  

## 2013-12-03 NOTE — Plan of Care (Signed)
Problem: Consults Goal: Diagnosis CEA/CES/AAA Stent Outcome: Completed/Met Date Met:  12/03/13 Carotid Endoscopic Stent (CES)

## 2013-12-03 NOTE — Plan of Care (Signed)
Problem: Phase I Progression Outcomes Goal: Vascular site scale level 0 - I Vascular Site Scale Level 0: No bruising/bleeding/hematoma Level I (Mild): Bruising/Ecchymosis, minimal bleeding/ooozing, palpable hematoma < 3 cm Level II (Moderate): Bleeding not affecting hemodynamic parameters, pseudoaneurysm, palpable hematoma > 3 cm Level III (Severe) Bleeding which affects hemodynamic parameters or retroperitoneal hemorrhage  Outcome: Progressing level0

## 2013-12-03 NOTE — Op Note (Signed)
Procedure: Right carotid angiogram, right carotid angioplasty and stenting   Preoperative diagnosis: High-grade right internal carotid artery stenosis  Postoperative diagnosis: Same   Anesthesia: Local  Co-surgeon: Quay Burow M.D.   Operative details: After obtaining informed consent, the patient was taken to the [facility] lab. The patient was placed in supine position the Angio table. Both groins were prepped and draped in usual sterile fashion. Local anesthesia was infiltrated over the right common femoral artery. Ultasound guidance was used to identify the right common femoral artery.  An introducer needle was placed into the right common femoral artery and there was good backbleeding from this. A 0.035 Versacore wire was then threaded up the abdominal aorta under fluoroscopic guidance.  A 6 French short sheath was used to dilate a tract.   A 6 French Cook shuttle sheath was then brought up on the operative field and advanced over the guidewire up into the ascending aorta. At this point an infusion of Angiomax was begun. A 5 French JB1 catheter was then advanced up into the aortic arch and a contrast angiogram was performed to determine the precise location of the right common carotid artery. Using the Dora catheter I then advanced the tip of the catheter into the right common carotid artery. The Versacore wire was removed and exchanged for an 035 Amplatz wire. The Amplatz wire was gently advanced up into the common carotid artery. The JB1 catheter was used as a rail and the sheath advanced over the guidewire into the right common carotid artery.  ACT was also checked and was 382. At this point an Abbott large filter was brought on the operative field and this was advanced up to the level of the carotid stenosis.  With some manipulation the guidewire advanced through the stenosis with minimal difficulty. The filter was then advanced into the distal internal carotid artery at the level of the skull base. The  filter was then deployed in this location. At this point a repeat contrast angiogram was performed to again determined the level of the stenosis. There was some spasm at this point in the ICA just distal to the stenosis.  A 3 x 2 angioplasty balloon was brought in operative field and advanced to the level of stenosis and quickly inflated and deflated to 10 atmospheres. The predilatation balloon was removed. A 7 x 9 x 30 Abbott stent was then brought on the operative field and advanced to the level of stenosis. Again using angiographic guidance, the stent was deployed so that the tip of the stent was past level of the high-grade stenosis. The stent was then deployed. A postdilatation was then performed after the stent delivery device was removed. The patient was given 0.5 mg atropine. This was done with a 5 x 2 balloon inflated to 10 atmospheres for 5 seconds. The pt had a brief episode of bradycardia which quickly recovered.  A completion arteriogram was performed which showed that the residual stenosis was essentially 0 and the stent had good wall apposition. There was still some residual spasm after capture of the filter distally but was improved.  Completion intracranial views were also performed in AP and lateral projection to be interpreted later by the neuroradiologist. The patient tolerated procedure well and there were no complications. After the completion arteriogram had been performed, the sheath was removed over a guidewire and these were then pulled down to the guidewire and the sheath exchanged for a 6 French short sheath in the right femoral artery. This was  thoroughly flushed with heparinized saline and was left in place to be pulled in the holding area after the ACT is less than 175. The patient tolerated the procedure well and there were no neurologic complications. The patient was transported to the holding area in stable condition.   Ruta Hinds, MD  [facility]  [place]  Office: [phone]  Pager: [phone]

## 2013-12-03 NOTE — H&P (View-Only) (Signed)
[facility]  HISTORY AND PHYSICAL    History of Present Illness:  Patient is a 73 y.o. year old male who presents for evaluation of asymptomatic carotid stenosis. The patient was initially evaluated by his primary care physician where bruit was noted. The patient denies any symptoms of TIA amaurosis or stroke. He denies prior history of myocardial infarction. He is fairly limited exercise capacity due to COPD. He states that he cannot climb 2 flights of stairs. He also complains of increased numbness and tingling in both feet. He denies claudication. He denies history of nonhealing wounds on his feet..  Other medical problems include hypertension diabetes both of which are currently stable. These are followed by his primary care physician. He is a former smoker but quit several years ago.  Recent carotid arteriogram is reviewed. He has a high-grade greater than 80% stenosis of the right internal carotid artery the lesion is fairly high in the neck above the mandible with some tortuosity.    Past Medical History   Diagnosis  Date   .  COPD (chronic obstructive pulmonary disease)     .  Arthritis     .  Melanoma     .  Hypertension     .  Diabetes mellitus without complication     .  Carotid artery occlusion     .  Peripheral arterial disease         Past Surgical History   Procedure  Laterality  Date   .  Tonsillectomy         Social History History   Substance Use Topics   .  Smoking status:  Former Smoker       Quit date:  09/17/2007   .  Smokeless tobacco:  Not on file   .  Alcohol Use:  No     Family History History reviewed. No pertinent family history.  Allergies  No Known Allergies     Current Outpatient Prescriptions   Medication  Sig  Dispense  Refill   .  aspirin 81 MG tablet  Take 81 mg by mouth daily.         .  Fluticasone-Salmeterol (ADVAIR DISKUS) 250-50 MCG/DOSE AEPB  Inhale 1 puff into the lungs 2 (two) times daily.   60 each   3    .  hydrochlorothiazide (HYDRODIURIL) 25 MG tablet  Take 1 tablet (25 mg total) by mouth daily.   30 tablet   3   .  simvastatin (ZOCOR) 5 MG tablet  Take 1 tablet (5 mg total) by mouth at bedtime.   30 tablet   3      No current facility-administered medications for this visit.     ROS:    General:  No weight loss, Fever, chills  HEENT: No recent headaches, no nasal bleeding, no visual changes, no sore throat  Neurologic: No dizziness, blackouts, seizures. No recent symptoms of stroke or mini- stroke. No recent episodes of slurred speech, or temporary blindness.  Cardiac: No recent episodes of chest pain/pressure, no shortness of breath at rest.  + shortness of breath with exertion.  Denies history of atrial fibrillation or irregular heartbeat  Vascular: No history of rest pain in feet.  No history of claudication.  No history of non-healing ulcer, No history of DVT    Pulmonary: No home oxygen, no productive cough, no hemoptysis,  No asthma or wheezing  Musculoskeletal:  [ ]  Arthritis, [ ]  Low back pain,  [ ]   Joint pain  Hematologic:No history of hypercoagulable state.  No history of easy bleeding.  No history of anemia  Gastrointestinal: No hematochezia or melena,  No gastroesophageal reflux, no trouble swallowing  Urinary: [ ]  chronic Kidney disease, [ ]  on HD - [ ]  MWF or [ ]  TTHS, [ ]  Burning with urination, [ ]  Frequent urination, [ ]  Difficulty urinating;    Skin: No rashes  Psychological: No history of anxiety,  No history of depression   Physical Examination     Filed Vitals:   11/18/13 0929 11/18/13 0930  BP: 154/69 133/69  Pulse: 92   Temp: 98.5 F (36.9 C)   TempSrc: Oral   Height: 5\' 6"  (1.676 m)   Weight: 226 lb (102.513 kg)   SpO2: 98%    General:  Alert and oriented, no acute distress HEENT: Normal Neck: Bilateral carotid bruits, 2+ carotid pulse Neurologic: Upper and lower extremity motor 5/5 and symmetric  ASSESSMENT:  Asymptomatic high-grade  right internal carotid artery stenosis. High anatomic lesion needs right carotid stenting for stroke prophylaxis.  PLAN:  Risks benefits possible complications and procedure details the carotid stenting were trying the patient today. I did explain him that there is some tortuosity of the lesion and we may not be able to get a stent to turn in this corner. Risk of stroke and other complications of carotid stenting were also explained the patient today. Also defined and the risks benefits possible complications of carotid endarterectomy. I also explained that the lesion is high enough that with a carotid endarterectomy he would be at significant risk of cranial nerve injury. He understands his risk of stroke is slightly higher with stenting and the risk of cranial nerve injury is higher with carotid endarterectomy. If that's the case and we would proceed carotid endarterectomy the following week. He was started on Plavix today. His carotid stent is scheduled for March 20.  Ruta Hinds, MD [facility] [place] Office: [phone] Pager: [phone]

## 2013-12-03 NOTE — Interval H&P Note (Signed)
History and Physical Interval Note:  12/03/2013 7:35 AM  Todd Poole  has presented today for surgery, with the diagnosis of Stenosis  The various methods of treatment have been discussed with the patient and family. After consideration of risks, benefits and other options for treatment, the patient has consented to  Procedure(s): CAROTID STENT INSERTION (Right) as a surgical intervention .  The patient's history has been reviewed, patient examined, no change in status, stable for surgery.  I have reviewed the patient's chart and labs.  Questions were answered to the patient's satisfaction.     [name] E

## 2013-12-04 LAB — BASIC METABOLIC PANEL
BUN: 19 mg/dL (ref 6–23)
CALCIUM: 8.5 mg/dL (ref 8.4–10.5)
CO2: 25 mEq/L (ref 19–32)
Chloride: 101 mEq/L (ref 96–112)
Creatinine, Ser: 1.85 mg/dL — ABNORMAL HIGH (ref 0.50–1.35)
GFR calc Af Amer: 40 mL/min — ABNORMAL LOW (ref >90)
GFR calc non Af Amer: 34 mL/min — ABNORMAL LOW (ref >90)
GLUCOSE: 113 mg/dL — AB (ref 70–99)
Potassium: 4.1 mEq/L (ref 3.7–5.3)
Sodium: 139 mEq/L (ref 137–147)

## 2013-12-04 LAB — CBC
HEMATOCRIT: 39.4 % (ref 39.0–52.0)
Hemoglobin: 13 g/dL (ref 13.0–17.0)
MCH: 30.7 pg (ref 26.0–34.0)
MCHC: 33 g/dL (ref 30.0–36.0)
MCV: 93.1 fL (ref 78.0–100.0)
Platelets: 186 10*3/uL (ref 150–400)
RBC: 4.23 MIL/uL (ref 4.22–5.81)
RDW: 12.7 % (ref 11.5–15.5)
WBC: 6.8 10*3/uL (ref 4.0–10.5)

## 2013-12-04 LAB — GLUCOSE, CAPILLARY: Glucose-Capillary: 117 mg/dL — ABNORMAL HIGH (ref 70–99)

## 2013-12-04 NOTE — Discharge Summary (Signed)
[facility] Discharge Summary  Todd Poole Jun 22, 1941 73 y.o. male  [id number]  Admission Date: 12/03/2013  Discharge Date: 12/04/13  Physician: Elam Dutch, MD  Admission Diagnosis: Stenosis   HPI:   This is a 73 y.o. male who presents for evaluation of asymptomatic carotid stenosis. The patient was initially evaluated by his primary care physician where bruit was noted. The patient denies any symptoms of TIA amaurosis or stroke. He denies prior history of myocardial infarction. He is fairly limited exercise capacity due to COPD. He states that he cannot climb 2 flights of stairs. He also complains of increased numbness and tingling in both feet. He denies claudication. He denies history of nonhealing wounds on his feet.. Other medical problems include hypertension diabetes both of which are currently stable. These are followed by his primary care physician. He is a former smoker but quit several years ago.  Recent carotid arteriogram is reviewed. He has a high-grade greater than 80% stenosis of the right internal carotid artery the lesion is fairly high in the neck above the mandible with some tortuosity.   Hospital Course:  The patient was admitted to the hospital and taken to the operating room on 12/03/2013 and underwent Right carotid angiogram, right carotid angioplasty and stenting.  The pt tolerated the procedure well and was transported to the PACU in good condition.   By POD 1, the pt neuro status is in tact.  His creatinine on admission was 1.80 and at dc is 1.85.  The remainder of the hospital course consisted of increasing mobilization and increasing intake of solids without difficulty.    Recent Labs  12/03/13 0650 12/04/13 0259  NA 141 139  K 3.4* 4.1  CL 100 101  CO2  --  25  GLUCOSE 126* 113*  BUN 18 19  CALCIUM  --  8.5    Recent Labs  12/03/13 0650 12/04/13 0259  WBC  --  6.8  HGB 15.3 13.0  HCT 45.0 39.4  PLT  --  186    No results found for this basename: INR,  in the last 72 hours  Discharge Instructions:   The patient is discharged to home with extensive instructions on wound care and progressive ambulation.  They are instructed not to drive or perform any heavy lifting until returning to see the physician in his office.  Discharge Orders   Future Appointments Provider Department Dept Phone   12/27/2013 12:30 PM [name], MD [facility] [phone]   Future Orders Complete By Expires   Call MD for:  redness, tenderness, or signs of infection (pain, swelling, bleeding, redness, odor or green/yellow discharge around incision site)  As directed    Call MD for:  severe or increased pain, loss or decreased feeling  in affected limb(s)  As directed    Call MD for:  temperature >100.5  As directed    CAROTID Sugery: Call MD for difficulty swallowing or speaking; weakness in arms or legs that is a new symtom; severe headache.  If you have increased swelling in the neck and/or  are having difficulty breathing, CALL 911  As directed    Driving Restrictions  As directed    Comments:     No driving for 2 weeks   Increase activity slowly  As directed    Comments:     Walk with assistance use walker or cane as needed   Lifting restrictions  As directed    Comments:  No lifting for 6 weeks   May shower   As directed    Resume previous diet  As directed       Discharge Diagnosis:  Stenosis  Secondary Diagnosis: Patient Active Problem List   Diagnosis Date Noted  . Carotid stenosis, asymptomatic 12/03/2013  . Preop cardiovascular exam 11/05/2013  . Occlusion and stenosis of carotid artery without mention of cerebral infarction 11/04/2013  . Carotid bruit 09/24/2013  . PAD (peripheral artery disease) 09/24/2013  . Atypical nevus 05/27/2013  . Nail problem 01/20/2013  . OA (osteoarthritis) 01/20/2013  . Diabetes mellitus 10/21/2012  . Obesity, unspecified 10/21/2012  . Leg  swelling 09/11/2012  . COPD (chronic obstructive pulmonary disease) 09/11/2012  . History of melanoma 09/11/2012  . Essential hypertension, benign 09/11/2012  . Lumbago 03/10/2012   Past Medical History  Diagnosis Date  . COPD (chronic obstructive pulmonary disease)   . Arthritis   . Melanoma   . Hypertension   . Diabetes mellitus without complication   . Carotid artery occlusion       Medication List         aspirin 81 MG tablet  Take 81 mg by mouth daily.     clopidogrel 75 MG tablet  Commonly known as:  PLAVIX  Take 1 tablet (75 mg total) by mouth daily.     Fluticasone-Salmeterol 250-50 MCG/DOSE Aepb  Commonly known as:  ADVAIR  Inhale 1 puff into the lungs 2 (two) times daily.     hydrochlorothiazide 25 MG tablet  Commonly known as:  HYDRODIURIL  Take 1 tablet (25 mg total) by mouth daily.     oxyCODONE 5 MG immediate release tablet  Commonly known as:  Oxy IR/ROXICODONE  Take 1-2 tablets (5-10 mg total) by mouth every 4 (four) hours as needed for moderate pain.     simvastatin 5 MG tablet  Commonly known as:  ZOCOR  Take 1 tablet (5 mg total) by mouth at bedtime.        Roxicodone #30 No Refill  Disposition: home  Patient's condition: is Good  Follow up: 1. Dr.  Oneida Alar in 2 weeks with a carotid duplex   Leontine Locket, PA-C [facility] [phone]  --- For [facility] use --- Instructions: Press F2 to tab through selections.  Delete question if not applicable.   Modified Rankin score at D/C (0-6): 0  IV medication needed for:  1. Hypertension: No 2. Hypotension: No  Post-op Complications: No  1. Post-op CVA or TIA: No  If yes: Event classification (right eye, left eye, right cortical, left cortical, verterobasilar, other): n/a  If yes: Timing of event (intra-op, <6 hrs post-op, >=6 hrs post-op, unknown): n/a  2. CN injury: No  If yes: CN n/a injuried   3. Myocardial infarction: No  If yes: Dx by (EKG or  clinical, Troponin): n/a  4.  CHF: No  5.  Dysrhythmia (new): No  6. Wound infection: No  7. Reperfusion symptoms: No  8. Return to OR: No  If yes: return to OR for (bleeding, neurologic, other CEA incision, other): n/a  Discharge medications: Statin use:  Yes If No: [ ]  For Medical reasons, [ ]  Non-compliant, [ ]  Not-indicated ASA use:  Yes  If No: [ ]  For Medical reasons, [ ]  Non-compliant, [ ]  Not-indicated Beta blocker use:  No If No: [ ]  For Medical reasons, [ ]  Non-compliant, [ ]  Not-indicated ACE-Inhibitor use:  No If No: [ ]  For Medical reasons, [ ]   Non-compliant, [ ]  Not-indicated P2Y12 Antagonist use: Yes, [x ] Plavix, [ ]  Plasugrel, [ ]  Ticlopinine, [ ]  Ticagrelor, [ ]  Other, [ ]  No for medical reason, [ ]  Non-compliant, [ ]  Not-indicated Anti-coagulant use:  No, [ ]  Warfarin, [ ]  Rivaroxaban, [ ]  Dabigatran, [ ]  Other, [ ]  No for medical reason, [ ]  Non-compliant, [ ]  Not-indicated  Agree with above. For D/C this AM  Deitra Mayo, MD, [place] [phone] 12/04/2013

## 2013-12-04 NOTE — Progress Notes (Signed)
Discharge instructions reviewed with patient.  These included follow up appointments, medication schedule, activity restrictions, when to call his MD, dietary restrictions, and information re: follow up care for his specific surgical intervention.  Patient discharged to own residence with wife via private vehicle.  Escorted to exit via wheelchair with belongings.

## 2013-12-04 NOTE — Progress Notes (Addendum)
[facility] Progress Note  12/04/2013 7:18 AM 1 Day Post-Op  Subjective:  No complaints  afebrile 834'H-962'I systolic HR 29'N regular 98% RA  Filed Vitals:   12/04/13 0442  BP: 109/47  Pulse: 73  Temp: 98.2 F (36.8 C)  Resp: 23     Physical Exam: Neuro:  In tact Incision:  Right groin is soft without hematoma Extremities:  Bilateral feet are warm.  CBC    Component Value Date/Time   WBC 6.8 12/04/2013 0259   RBC 4.23 12/04/2013 0259   HGB 13.0 12/04/2013 0259   HCT 39.4 12/04/2013 0259   PLT 186 12/04/2013 0259   MCV 93.1 12/04/2013 0259   MCH 30.7 12/04/2013 0259   MCHC 33.0 12/04/2013 0259   RDW 12.7 12/04/2013 0259   LYMPHSABS 1.7 05/25/2013 1307   MONOABS 0.5 05/25/2013 1307   EOSABS 0.1 05/25/2013 1307   BASOSABS 0.0 05/25/2013 1307    BMET    Component Value Date/Time   NA 139 12/04/2013 0259   K 4.1 12/04/2013 0259   CL 101 12/04/2013 0259   CO2 25 12/04/2013 0259   GLUCOSE 113* 12/04/2013 0259   BUN 19 12/04/2013 0259   CREATININE 1.85* 12/04/2013 0259   CREATININE 1.61* 09/29/2013 0958   CALCIUM 8.5 12/04/2013 0259   GFRNONAA 34* 12/04/2013 0259   GFRAA 40* 12/04/2013 0259     Intake/Output Summary (Last 24 hours) at 12/04/13 0718 Last data filed at 12/04/13 0442  Gross per 24 hour  Intake   2335 ml  Output   2100 ml  Net    235 ml      Assessment/Plan:  This is a 73 y.o. male who is s/p Right carotid angiogram, right carotid angioplasty and stenting 1 Day Post-Op  -pt is doing well this am. -pt neuro exam is in tact -pt has not ambulated -pt has voided -pt's creatinine up slightly this am from yesterday, but good UOP -discharge home.  F/u with Dr. Oneida Alar in 2 weeks with carotid duplex.  Will have his renal function checked before he sees Dr. Oneida Alar.   Leontine Locket, PA-C [facility] [phone]  Agree with above. For D/C today.  Deitra Mayo, MD, FACS [facility] [phone] 12/04/2013

## 2013-12-06 ENCOUNTER — Other Ambulatory Visit: Payer: Self-pay | Admitting: *Deleted

## 2013-12-06 DIAGNOSIS — Z48812 Encounter for surgical aftercare following surgery on the circulatory system: Secondary | ICD-10-CM

## 2013-12-06 DIAGNOSIS — I6529 Occlusion and stenosis of unspecified carotid artery: Secondary | ICD-10-CM

## 2013-12-06 LAB — GLUCOSE, CAPILLARY: Glucose-Capillary: 106 mg/dL — ABNORMAL HIGH (ref 70–99)

## 2013-12-06 MED FILL — Sodium Chloride IV Soln 0.9%: INTRAVENOUS | Qty: 50 | Status: AC

## 2013-12-09 ENCOUNTER — Telehealth: Payer: Self-pay | Admitting: Vascular Surgery

## 2013-12-09 NOTE — Telephone Encounter (Addendum)
Message copied by Gena Fray on Thu Dec 09, 2013  3:51 PM ------      Message from: Peter Minium K      Created: Mon Dec 06, 2013  8:27 AM                   ----- Message -----         From: Gabriel Earing, PA-C         Sent: 12/04/2013   7:26 AM           To: [facility] Charge Pool            Please make sure this pt has a BMP before he comes back to see Dr. Trula Slade to check his renal function.            Thanks,      Samantha ------  12/09/13: spoke with pt, he is aware of appt and that he needs to go to [facility] for lab work prior, dpm

## 2013-12-27 ENCOUNTER — Ambulatory Visit (INDEPENDENT_AMBULATORY_CARE_PROVIDER_SITE_OTHER): Payer: Medicare Other | Admitting: Family Medicine

## 2013-12-27 ENCOUNTER — Other Ambulatory Visit: Payer: Self-pay | Admitting: Vascular Surgery

## 2013-12-27 ENCOUNTER — Encounter: Payer: Self-pay | Admitting: Family Medicine

## 2013-12-27 VITALS — BP 138/64 | HR 80 | Temp 97.9°F | Resp 16 | Ht 67.0 in | Wt 228.0 lb

## 2013-12-27 DIAGNOSIS — I6529 Occlusion and stenosis of unspecified carotid artery: Secondary | ICD-10-CM

## 2013-12-27 DIAGNOSIS — I10 Essential (primary) hypertension: Secondary | ICD-10-CM

## 2013-12-27 DIAGNOSIS — N183 Chronic kidney disease, stage 3 unspecified: Secondary | ICD-10-CM

## 2013-12-27 DIAGNOSIS — E119 Type 2 diabetes mellitus without complications: Secondary | ICD-10-CM

## 2013-12-27 DIAGNOSIS — Z23 Encounter for immunization: Secondary | ICD-10-CM

## 2013-12-27 NOTE — Assessment & Plan Note (Addendum)
His renal function that was checked this morning. I will followup on the labs  We may need renal input, no ACEI

## 2013-12-27 NOTE — Progress Notes (Signed)
Patient ID: Todd Poole, male   DOB: 07-19-1941, 73 y.o.   MRN: [id number]   Subjective:    Patient ID: Todd Poole, male    DOB: Sep 02, 1941, 73 y.o.   MRN: [id number]  Patient presents for 3 month F/U  patient here to follow chronic medical problems. He was seen by vascular surgery and had a stent placed in his right carotid. He is followup in 2 weeks. Was noted that his renal function is worsening his creatinine got up to 1.8. He had repeat metabolic panel this morning. Diabetes mellitus he's been trying to watch his sugar intake the nose he has a lot to improve on. He is snacking too much with sodas as well as cookies and eating too much salt. He did not check his sugar recently. Marland Kitchen He was seen by podiatry He has no new concerns today    Review Of Systems:  GEN- denies fatigue, fever, weight loss,weakness, recent illness HEENT- denies eye drainage, change in vision, nasal discharge, CVS- denies chest pain, palpitations RESP- denies SOB, cough, wheeze ABD- denies N/V, change in stools, abd pain GU- denies dysuria, hematuria, dribbling, incontinence MSK- denies joint pain, muscle aches, injury Neuro- denies headache, dizziness, syncope, seizure activity       Objective:    BP 138/64  Pulse 80  Temp(Src) 97.9 F (36.6 C) (Oral)  Resp 16  Ht 5\' 7"  (1.702 m)  Wt 228 lb (103.42 kg)  BMI 35.70 kg/m2 GEN- NAD, alert and oriented x3 HEENT- PERRL, EOMI, non injected sclera, pink conjunctiva, MMM, oropharynx clear CVS- RRR, no murmur RESP-CTAB ABD-NABS,soft,NT,ND EXT- trace edema Pulses- Radial 2+, DP- decreased bilat      Assessment & Plan:      Problem List Items Addressed This Visit   Occlusion and stenosis of carotid artery without mention of cerebral infarction   Relevant Orders      Hepatic Function Panel      Lipid panel   Diabetes mellitus   Relevant Orders      Hemoglobin A1c    Other Visit Diagnoses   Need for prophylactic vaccination against  Streptococcus pneumoniae (pneumococcus)    -  Primary    Relevant Orders       Pneumococcal conjugate vaccine 13-valent (Completed)       Note: This dictation was prepared with Dragon dictation along with smaller phrase technology. Any transcriptional errors that result from this process are unintentional.

## 2013-12-27 NOTE — Patient Instructions (Addendum)
Continue current medications We will follow up with the kidneys Prevnar 13 given Work on cutting out sugars and cookies " avoid the white foods- bread, biscuits, pasta, rice, potatoes) F/U 4 months

## 2013-12-27 NOTE — Assessment & Plan Note (Signed)
Blood pressure looks good today no change in medication

## 2013-12-27 NOTE — Assessment & Plan Note (Signed)
Recheck his A1c. Remained have to try something like Januvia prevent any weight gain any hypoglycemia.

## 2013-12-28 LAB — HEPATIC FUNCTION PANEL
ALK PHOS: 46 U/L (ref 39–117)
ALT: 26 U/L (ref 0–53)
AST: 23 U/L (ref 0–37)
Albumin: 4.2 g/dL (ref 3.5–5.2)
BILIRUBIN DIRECT: 0.2 mg/dL (ref 0.0–0.3)
BILIRUBIN INDIRECT: 0.6 mg/dL (ref 0.2–1.2)
BILIRUBIN TOTAL: 0.8 mg/dL (ref 0.2–1.2)
Total Protein: 7.1 g/dL (ref 6.0–8.3)

## 2013-12-28 LAB — LIPID PANEL
CHOL/HDL RATIO: 3.9 ratio
Cholesterol: 132 mg/dL (ref 0–200)
HDL: 34 mg/dL — ABNORMAL LOW (ref >39)
LDL CALC: 67 mg/dL (ref 0–99)
Triglycerides: 156 mg/dL — ABNORMAL HIGH (ref <150)
VLDL: 31 mg/dL (ref 0–40)

## 2013-12-28 LAB — HEMOGLOBIN A1C
Hgb A1c MFr Bld: 6.6 % — ABNORMAL HIGH (ref <5.7)
Mean Plasma Glucose: 143 mg/dL — ABNORMAL HIGH (ref <117)

## 2013-12-28 LAB — BASIC METABOLIC PANEL
BUN: 20 mg/dL (ref 6–23)
CO2: 28 mEq/L (ref 19–32)
Calcium: 9.4 mg/dL (ref 8.4–10.5)
Chloride: 102 mEq/L (ref 96–112)
Creat: 1.73 mg/dL — ABNORMAL HIGH (ref 0.50–1.35)
Glucose, Bld: 111 mg/dL — ABNORMAL HIGH (ref 70–99)
POTASSIUM: 3.6 meq/L (ref 3.5–5.3)
SODIUM: 141 meq/L (ref 135–145)

## 2013-12-29 ENCOUNTER — Encounter: Payer: Self-pay | Admitting: Vascular Surgery

## 2013-12-30 ENCOUNTER — Encounter: Payer: Self-pay | Admitting: Vascular Surgery

## 2013-12-30 ENCOUNTER — Ambulatory Visit (INDEPENDENT_AMBULATORY_CARE_PROVIDER_SITE_OTHER): Payer: Medicare Other | Admitting: Vascular Surgery

## 2013-12-30 ENCOUNTER — Ambulatory Visit (HOSPITAL_COMMUNITY)
Admission: RE | Admit: 2013-12-30 | Discharge: 2013-12-30 | Disposition: A | Discharge status: Home / Self Care | Payer: Medicare Other | Source: Ambulatory Visit | Attending: Vascular Surgery | Admitting: Vascular Surgery

## 2013-12-30 VITALS — BP 126/64 | HR 80 | Ht 67.0 in | Wt 226.0 lb

## 2013-12-30 DIAGNOSIS — Z48812 Encounter for surgical aftercare following surgery on the circulatory system: Secondary | ICD-10-CM | POA: Insufficient documentation

## 2013-12-30 DIAGNOSIS — I6529 Occlusion and stenosis of unspecified carotid artery: Secondary | ICD-10-CM

## 2013-12-30 NOTE — Progress Notes (Signed)
This is a 73 year old male status post right carotid stenting March 20. He returns for followup today. He is on Plavix and aspirin. Serum creatinine was checked on April 13 and is stable at 1.7. He denies any symptoms of TIA amaurosis or stroke. He is trying to refrain from using tobacco products. He is looking into an exercise program for weight loss.  Review of systems: He has shortness of breath with exertion. He denies chest pain.  Physical exam:  Filed Vitals:   12/30/13 1116 12/30/13 1120  BP: 134/72 126/64  Pulse: 80   Height: 5\' 7"  (1.702 m)   Weight: 226 lb (102.513 kg)   SpO2: 99%    Neck: 2+ carotid pulses no bruit Neuro: Symmetric upper extremity and lower extremity strength which is 5 over 5  Extremities: No hematoma right groin puncture site  Data: Patient had a carotid duplex exam today which I reviewed and interpreted. No significant stenosis in the right internal carotid artery status post stenting. 50% stenosis left common carotid artery.  Assessment: Doing well status post right carotid stent  Plan: Followup carotid duplex scan in 6 months continue Plavix aspirin indefinitely  Ruta Hinds, MD [facility] [place] Office: [phone] Pager: [phone]

## 2013-12-31 NOTE — Addendum Note (Signed)
Addended by: Dorthula Rue L on: 12/31/2013 12:41 PM   Modules accepted: Orders

## 2014-02-23 ENCOUNTER — Other Ambulatory Visit: Payer: Self-pay | Admitting: Family Medicine

## 2014-02-23 NOTE — Telephone Encounter (Signed)
Refill appropriate and filled per protocol. 

## 2014-04-28 ENCOUNTER — Ambulatory Visit (INDEPENDENT_AMBULATORY_CARE_PROVIDER_SITE_OTHER): Payer: Medicare Other | Admitting: Family Medicine

## 2014-04-28 ENCOUNTER — Encounter: Payer: Self-pay | Admitting: Family Medicine

## 2014-04-28 VITALS — BP 138/82 | HR 64 | Temp 98.1°F | Resp 12 | Ht 69.0 in | Wt 220.0 lb

## 2014-04-28 DIAGNOSIS — I658 Occlusion and stenosis of other precerebral arteries: Secondary | ICD-10-CM

## 2014-04-28 DIAGNOSIS — N183 Chronic kidney disease, stage 3 unspecified: Secondary | ICD-10-CM

## 2014-04-28 DIAGNOSIS — I6529 Occlusion and stenosis of unspecified carotid artery: Secondary | ICD-10-CM

## 2014-04-28 DIAGNOSIS — I739 Peripheral vascular disease, unspecified: Secondary | ICD-10-CM

## 2014-04-28 DIAGNOSIS — I10 Essential (primary) hypertension: Secondary | ICD-10-CM

## 2014-04-28 DIAGNOSIS — L820 Inflamed seborrheic keratosis: Secondary | ICD-10-CM

## 2014-04-28 DIAGNOSIS — E1159 Type 2 diabetes mellitus with other circulatory complications: Secondary | ICD-10-CM

## 2014-04-28 DIAGNOSIS — I6523 Occlusion and stenosis of bilateral carotid arteries: Secondary | ICD-10-CM

## 2014-04-28 LAB — LIPID PANEL
CHOL/HDL RATIO: 3.3 ratio
Cholesterol: 124 mg/dL (ref 0–200)
HDL: 38 mg/dL — ABNORMAL LOW (ref >39)
LDL Cholesterol: 66 mg/dL (ref 0–99)
Triglycerides: 98 mg/dL (ref <150)
VLDL: 20 mg/dL (ref 0–40)

## 2014-04-28 LAB — HEMOGLOBIN A1C
Hgb A1c MFr Bld: 6.6 % — ABNORMAL HIGH (ref <5.7)
Mean Plasma Glucose: 143 mg/dL — ABNORMAL HIGH (ref <117)

## 2014-04-28 NOTE — Addendum Note (Signed)
Addended by: Vic Blackbird F on: 04/28/2014 11:02 PM   Modules accepted: Level of Service

## 2014-04-28 NOTE — Assessment & Plan Note (Signed)
Diet controlled, check las

## 2014-04-28 NOTE — Assessment & Plan Note (Signed)
Goal LDL , 100, plavix, aspirin

## 2014-04-28 NOTE — Progress Notes (Signed)
Patient ID: Todd Poole, male   DOB: [birth year], 73 y.o.   MRN: [id number] Note pt also seeing derm, he had warty like keratosis on anterior left neck, that has been evalated before but never treated Cryotherapy 10sec x 2 passes at bedside Tolerated procedure well F/U derm as scheduled

## 2014-04-28 NOTE — Patient Instructions (Signed)
Continue current medications F/U 4 months PHYSICAL

## 2014-04-28 NOTE — Progress Notes (Signed)
Patient ID: Todd Poole, male   DOB: [birth year], 73 y.o.   MRN: [id number]   Subjective:    Patient ID: Todd Poole, male    DOB: [birth year], 73 y.o.   MRN: [id number]  Patient presents for 4 month F/U No specific concerns, medications reviewed, recent carotid surgery. Has f/u with podiatry coming up for nail fungus and nail trimming. DM - diet controlled, due for repeat fasting labs    Review Of Systems:  GEN- denies fatigue, fever, weight loss,weakness, recent illness HEENT- denies eye drainage, change in vision, nasal discharge, CVS- denies chest pain, palpitations RESP- denies SOB, cough, wheeze ABD- denies N/V, change in stools, abd pain GU- denies dysuria, hematuria, dribbling, incontinence MSK- denies joint pain, muscle aches, injury Neuro- denies headache, dizziness, syncope, seizure activity       Objective:    BP 138/82  Pulse 64  Temp(Src) 98.1 F (36.7 C) (Oral)  Resp 12  Ht 5\' 9"  (1.753 m)  Wt 220 lb (99.791 kg)  BMI 32.47 kg/m2 GEN- NAD, alert and oriented x3 HEENT- PERRL, EOMI, non injected sclera, pink conjunctiva, MMM, oropharynx clear CVS- RRR, no murmur RESP-CTAB EXT- No edema, thick long nails, few varicose veins Pulses- Radial 2+, DP- 1+        Assessment & Plan:      Problem List Items Addressed This Visit   PAD (peripheral artery disease)   Essential hypertension, benign   Diabetes mellitus   Relevant Orders      Hemoglobin A1c   CKD (chronic kidney disease), stage III - Primary   Relevant Orders      COMPLETE METABOLIC PANEL WITH GFR   Carotid stenosis, asymptomatic   Relevant Orders      Lipid panel      Note: This dictation was prepared with Dragon dictation along with smaller phrase technology. Any transcriptional errors that result from this process are unintentional.

## 2014-04-28 NOTE — Assessment & Plan Note (Signed)
Well-controlled, no change 

## 2014-04-29 ENCOUNTER — Ambulatory Visit: Payer: Medicare Other | Admitting: Family Medicine

## 2014-04-29 ENCOUNTER — Encounter: Payer: Self-pay | Admitting: *Deleted

## 2014-05-30 ENCOUNTER — Ambulatory Visit (INDEPENDENT_AMBULATORY_CARE_PROVIDER_SITE_OTHER): Payer: Medicare Other | Admitting: *Deleted

## 2014-05-30 DIAGNOSIS — Z23 Encounter for immunization: Secondary | ICD-10-CM

## 2014-05-31 ENCOUNTER — Other Ambulatory Visit: Payer: Self-pay | Admitting: Family Medicine

## 2014-05-31 NOTE — Telephone Encounter (Signed)
Refill appropriate and filled per protocol. 

## 2014-07-06 ENCOUNTER — Encounter: Payer: Self-pay | Admitting: Family

## 2014-07-07 ENCOUNTER — Encounter: Payer: Self-pay | Admitting: Family

## 2014-07-07 ENCOUNTER — Ambulatory Visit (INDEPENDENT_AMBULATORY_CARE_PROVIDER_SITE_OTHER): Payer: Medicare Other | Admitting: Family

## 2014-07-07 ENCOUNTER — Ambulatory Visit (HOSPITAL_COMMUNITY)
Admission: RE | Admit: 2014-07-07 | Discharge: 2014-07-07 | Disposition: A | Discharge status: Home / Self Care | Payer: Medicare Other | Source: Ambulatory Visit | Attending: Family | Admitting: Family

## 2014-07-07 VITALS — BP 124/68 | HR 79 | Resp 16 | Ht 66.0 in | Wt 215.0 lb

## 2014-07-07 DIAGNOSIS — Z48812 Encounter for surgical aftercare following surgery on the circulatory system: Secondary | ICD-10-CM | POA: Insufficient documentation

## 2014-07-07 DIAGNOSIS — I6529 Occlusion and stenosis of unspecified carotid artery: Secondary | ICD-10-CM | POA: Insufficient documentation

## 2014-07-07 DIAGNOSIS — I6523 Occlusion and stenosis of bilateral carotid arteries: Secondary | ICD-10-CM

## 2014-07-07 NOTE — Progress Notes (Signed)
Established Carotid Patient   History of Present Illness  Todd Poole is a 73 y.o. male patient of Dr. Oneida Alar who is status post right carotid stenting December 03, 2013. He returns for followup today. He is on Plavix and aspirin  The patient has no history of TIA or stroke symptoms, specifically the patient denies a history of amaurosis fugax or monocular blindness, denies a history unilateral  of facial drooping, denies a history of hemiplegia, and denies a history of receptive or expressive aphasia.   The patient denies claudication symptoms in his legs with walking.  The patient denies New Medical or Surgical History.  Pt Diabetic: "borderline" Pt smoker: former smoker, quit in 2003, states he was a heavy smoker for many years  Pt meds include: Statin : Yes ASA: Yes Other anticoagulants/antiplatelets: Plavix   Past Medical History  Diagnosis Date  . COPD (chronic obstructive pulmonary disease)   . Arthritis   . Melanoma   . Hypertension   . Diabetes mellitus without complication   . Carotid artery occlusion     Social History History  Substance Use Topics  . Smoking status: Former Smoker -- 3.00 packs/day for 50 years    Types: Cigarettes    Quit date: 09/17/2007  . Smokeless tobacco: Never Used  . Alcohol Use: No    Family History Family History  Problem Relation Age of Onset  . Dementia Father   . Kidney disease Father     Kidney Stones    Surgical History Past Surgical History  Procedure Laterality Date  . Tonsillectomy    . Carotid stent Right December 03, 2013    No Known Allergies  Current Outpatient Prescriptions  Medication Sig Dispense Refill  . aspirin 81 MG tablet Take 81 mg by mouth daily.      . clopidogrel (PLAVIX) 75 MG tablet Take 1 tablet (75 mg total) by mouth daily.  30 tablet  6  . hydrochlorothiazide (HYDRODIURIL) 25 MG tablet TAKE ONE TABLET BY MOUTH ONCE DAILY  30 tablet  3  . simvastatin (ZOCOR) 5 MG tablet TAKE ONE TABLET BY  MOUTH AT BEDTIME  30 tablet  3  . Fluticasone-Salmeterol (ADVAIR) 250-50 MCG/DOSE AEPB Inhale 1 puff into the lungs 2 (two) times daily.        No current facility-administered medications for this visit.    Review of Systems : See HPI for pertinent positives and negatives.  Physical Examination  Filed Vitals:   07/07/14 1527 07/07/14 1533  BP: 115/58 124/68  Pulse: 78 79  Resp:  16  Height:  5\' 6"  (1.676 m)  Weight:  215 lb (97.523 kg)  SpO2:  96%   Body mass index is 34.72 kg/(m^2).  General: WDWN obese male in NAD GAIT: normal Eyes: PERRLA Pulmonary:  Non-labored, CTAB, Negative  Rales, Negative rhonchi, & Negative wheezing.  Cardiac: regular Rhythm ,  Negative detected murmur.  VASCULAR EXAM Carotid Bruits Right Left   Negative Negative    Aorta is not palpable. Radial pulses are 2+ palpable and equal.  LE Pulses Right Left       POPLITEAL  not palpable   not palpable       POSTERIOR TIBIAL   palpable    palpable        DORSALIS PEDIS      ANTERIOR TIBIAL   palpable    palpable     Gastrointestinal: soft, nontender, BS WNL, no r/g,  No palpated masses.  Musculoskeletal: Negative muscle atrophy/wasting. M/S 5/5 throughout, Extremities without ischemic changes.  Neurologic: A&O X 3; Appropriate Affect ; SENSATION ;normal;  Speech is normal CN 2-12 intact, Pain and light touch intact in extremities, Motor exam as listed above.   Non-Invasive Vascular Imaging CAROTID DUPLEX 07/07/2014   CEREBROVASCULAR DUPLEX EVALUATION     INDICATION: Carotid artery disease; evaluation status post stent placement.    PREVIOUS INTERVENTION(S): Right internal carotid artery stent 12/03/2013    DUPLEX EXAM:     RIGHT  LEFT  Peak Systolic Velocities (cm/s) End Diastolic Velocities (cm/s) Plaque LOCATION Peak Systolic Velocities (cm/s) End Diastolic  Velocities (cm/s) Plaque  87 16 HT CCA PROXIMAL 115 20 HT  114 19 HT CCA MID 308 46 HT  127 20 HT CCA DISTAL 311 47 HT  116 14 - ECA 182 9 -  Stent - - ICA PROXIMAL 186 44 HT  - - - ICA MID 101 19 -  - - - ICA DISTAL 90 21 -    N/A ICA / CCA Ratio (PSV) N/A  Antegrade Vertebral Flow Antegrade  782 Brachial Systolic Pressure (mmHg) 956  Triphasic Brachial Artery Waveforms Triphasic    Peak Systolic Velocities (cm/s) End Diastolic Velocities (cm/s) Plaque STENT (  ) Peak Systolic Velocities (cm/s) End Diastolic Velocities (cm/s) Plaque  130 25 - PROXIMAL     117 28 - MID     110 28 - DISTAL       Plaque Morphology:  HM = Homogeneous, HT = Heterogeneous, CP = Calcific Plaque, SP = Smooth Plaque, IP = Irregular Plaque    ADDITIONAL FINDINGS: Diffuse plaque in the bilateral common carotid artery in the proximal to distal segment with increased velocity on the left.     IMPRESSION: 1. Patent right internal carotid artery stent without evidence of restenosis. 2. 40 - 59% left internal carotid artery stenosis, the internal carotid artery is noted to be somewhat tortuous however an increase of velocity (>50%) in the common carotid artery  may decrease velocity in the proximal internal carotid artery    Compared to the previous exam:  No significant change      Assessment: Todd Poole is a 73 y.o. male who presents with asymptomatic patent right internal carotid artery stent without evidence of restenosis, 40 - 59% left internal carotid artery stenosis, the internal carotid artery is noted to be somewhat tortuous however an increase of velocity (>50%) in the common carotid artery  may decrease velocity in the proximal internal carotid artery. The  ICA stenosis is  Unchanged from previous exam. Dr. Oneida Alar progress note from March 2015 indicates to continue the ASA and Plavix indefinitely.   Plan: Based on today's exam and Duplex results, and after discussing with Dr. Oneida Alar,  follow-up in 6 months with Carotid Duplex scan.   I discussed in depth with the patient the nature of atherosclerosis, and emphasized the importance of maximal medical management including strict control of blood pressure, blood glucose, and lipid levels, obtaining regular exercise, and continued cessation of smoking.  The patient is aware that  without maximal medical management the underlying atherosclerotic disease process will progress, limiting the benefit of any interventions. The patient was given information about stroke prevention and what symptoms should prompt the patient to seek immediate medical care. Thank you for allowing [place] to participate in this patient's care.   Clemon Chambers, RN, MSN, FNP-C [facility] Office: [phone]  Clinic Physician: Oneida Alar  07/07/2014 3:39 PM

## 2014-07-07 NOTE — Patient Instructions (Signed)
Stroke Prevention Some medical conditions and behaviors are associated with an increased chance of having a stroke. You may prevent a stroke by making healthy choices and managing medical conditions. HOW CAN I REDUCE MY RISK OF HAVING A STROKE?   Stay physically active. Get at least 30 minutes of activity on most or all days.  Do not smoke. It may also be helpful to avoid exposure to secondhand smoke.  Limit alcohol use. Moderate alcohol use is considered to be:  No more than 2 drinks per day for men.  No more than 1 drink per day for nonpregnant women.  Eat healthy foods. This involves:  Eating 5 or more servings of fruits and vegetables a day.  Making dietary changes that address high blood pressure (hypertension), high cholesterol, diabetes, or obesity.  Manage your cholesterol levels.  Making food choices that are high in fiber and low in saturated fat, trans fat, and cholesterol may control cholesterol levels.  Take any prescribed medicines to control cholesterol as directed by your health care provider.  Manage your diabetes.  Controlling your carbohydrate and sugar intake is recommended to manage diabetes.  Take any prescribed medicines to control diabetes as directed by your health care provider.  Control your hypertension.  Making food choices that are low in salt (sodium), saturated fat, trans fat, and cholesterol is recommended to manage hypertension.  Take any prescribed medicines to control hypertension as directed by your health care provider.  Maintain a healthy weight.  Reducing calorie intake and making food choices that are low in sodium, saturated fat, trans fat, and cholesterol are recommended to manage weight.  Stop drug abuse.  Avoid taking birth control pills.  Talk to your health care provider about the risks of taking birth control pills if you are over 35 years old, smoke, get migraines, or have ever had a blood clot.  Get evaluated for sleep  disorders (sleep apnea).  Talk to your health care provider about getting a sleep evaluation if you snore a lot or have excessive sleepiness.  Take medicines only as directed by your health care provider.  For some people, aspirin or blood thinners (anticoagulants) are helpful in reducing the risk of forming abnormal blood clots that can lead to stroke. If you have the irregular heart rhythm of atrial fibrillation, you should be on a blood thinner unless there is a good reason you cannot take them.  Understand all your medicine instructions.  Make sure that other conditions (such as anemia or atherosclerosis) are addressed. SEEK IMMEDIATE MEDICAL CARE IF:   You have sudden weakness or numbness of the face, arm, or leg, especially on one side of the body.  Your face or eyelid droops to one side.  You have sudden confusion.  You have trouble speaking (aphasia) or understanding.  You have sudden trouble seeing in one or both eyes.  You have sudden trouble walking.  You have dizziness.  You have a loss of balance or coordination.  You have a sudden, severe headache with no known cause.  You have new chest pain or an irregular heartbeat. Any of these symptoms may represent a serious problem that is an emergency. Do not wait to see if the symptoms will go away. Get medical help at once. Call your local emergency services (911 in [place] Do not drive yourself to the hospital. Document Released: 10/10/2004 Document Revised: 01/17/2014 Document Reviewed: 03/05/2013 [employer] Patient Information 2015 [employer]. This information is not intended to replace advice given   to you by your health care provider. Make sure you discuss any questions you have with your health care provider.  

## 2014-07-08 ENCOUNTER — Other Ambulatory Visit: Payer: Self-pay | Admitting: Vascular Surgery

## 2014-07-08 DIAGNOSIS — I739 Peripheral vascular disease, unspecified: Secondary | ICD-10-CM

## 2014-07-08 NOTE — Addendum Note (Signed)
Addended by: Mena Goes on: 07/08/2014 09:51 AM   Modules accepted: Orders

## 2014-07-22 ENCOUNTER — Other Ambulatory Visit: Payer: Self-pay | Admitting: *Deleted

## 2014-07-22 MED ORDER — SIMVASTATIN 5 MG PO TABS: ORAL_TABLET | ORAL | Status: DC

## 2014-07-22 NOTE — Telephone Encounter (Signed)
Received fax requesting refill on Simvastatin.   Refill appropriate and filled per protocol.

## 2014-08-09 ENCOUNTER — Other Ambulatory Visit: Payer: Self-pay | Admitting: *Deleted

## 2014-08-09 MED ORDER — SIMVASTATIN 5 MG PO TABS: ORAL_TABLET | ORAL | Status: AC

## 2014-08-09 NOTE — Telephone Encounter (Signed)
Received fax requesting refill on Zocor with 90 day supply.   Refill appropriate and filled per protocol.

## 2014-08-25 ENCOUNTER — Encounter (HOSPITAL_COMMUNITY): Payer: Self-pay | Admitting: Surgery

## 2014-08-30 ENCOUNTER — Encounter: Payer: Medicare Other | Admitting: Family Medicine

## 2014-11-15 DIAGNOSIS — I10 Essential (primary) hypertension: Secondary | ICD-10-CM | POA: Diagnosis not present

## 2014-11-15 DIAGNOSIS — J449 Chronic obstructive pulmonary disease, unspecified: Secondary | ICD-10-CM | POA: Diagnosis not present

## 2014-11-15 DIAGNOSIS — N183 Chronic kidney disease, stage 3 (moderate): Secondary | ICD-10-CM | POA: Diagnosis not present

## 2014-11-15 DIAGNOSIS — I739 Peripheral vascular disease, unspecified: Secondary | ICD-10-CM | POA: Diagnosis not present

## 2014-11-15 DIAGNOSIS — I6521 Occlusion and stenosis of right carotid artery: Secondary | ICD-10-CM | POA: Diagnosis not present

## 2015-01-19 ENCOUNTER — Other Ambulatory Visit (HOSPITAL_COMMUNITY): Payer: Medicare Other

## 2015-01-19 ENCOUNTER — Ambulatory Visit: Payer: Medicare Other | Admitting: Family

## 2015-02-02 ENCOUNTER — Encounter: Payer: Self-pay | Admitting: Family

## 2015-02-03 ENCOUNTER — Encounter: Payer: Self-pay | Admitting: Family

## 2015-02-03 ENCOUNTER — Ambulatory Visit (INDEPENDENT_AMBULATORY_CARE_PROVIDER_SITE_OTHER): Payer: Medicare Other | Admitting: Family

## 2015-02-03 ENCOUNTER — Ambulatory Visit (HOSPITAL_COMMUNITY)
Admission: RE | Admit: 2015-02-03 | Discharge: 2015-02-03 | Disposition: A | Discharge status: Home / Self Care | Payer: Medicare Other | Source: Ambulatory Visit | Attending: Family | Admitting: Family

## 2015-02-03 VITALS — BP 118/75 | HR 103 | Resp 18 | Ht 66.5 in | Wt 199.0 lb

## 2015-02-03 DIAGNOSIS — Z959 Presence of cardiac and vascular implant and graft, unspecified: Secondary | ICD-10-CM

## 2015-02-03 DIAGNOSIS — I6523 Occlusion and stenosis of bilateral carotid arteries: Secondary | ICD-10-CM | POA: Insufficient documentation

## 2015-02-03 DIAGNOSIS — Z87891 Personal history of nicotine dependence: Secondary | ICD-10-CM

## 2015-02-03 DIAGNOSIS — Z48812 Encounter for surgical aftercare following surgery on the circulatory system: Secondary | ICD-10-CM | POA: Insufficient documentation

## 2015-02-03 DIAGNOSIS — Z9889 Other specified postprocedural states: Secondary | ICD-10-CM

## 2015-02-03 NOTE — Addendum Note (Signed)
Addended by: Dorthula Rue L on: 02/03/2015 03:19 PM   Modules accepted: Orders

## 2015-02-03 NOTE — Patient Instructions (Signed)
Stroke Prevention Some medical conditions and behaviors are associated with an increased chance of having a stroke. You may prevent a stroke by making healthy choices and managing medical conditions. HOW CAN I REDUCE MY RISK OF HAVING A STROKE?   Stay physically active. Get at least 30 minutes of activity on most or all days.  Do not smoke. It may also be helpful to avoid exposure to secondhand smoke.  Limit alcohol use. Moderate alcohol use is considered to be:  No more than 2 drinks per day for men.  No more than 1 drink per day for nonpregnant women.  Eat healthy foods. This involves:  Eating 5 or more servings of fruits and vegetables a day.  Making dietary changes that address high blood pressure (hypertension), high cholesterol, diabetes, or obesity.  Manage your cholesterol levels.  Making food choices that are high in fiber and low in saturated fat, trans fat, and cholesterol may control cholesterol levels.  Take any prescribed medicines to control cholesterol as directed by your health care provider.  Manage your diabetes.  Controlling your carbohydrate and sugar intake is recommended to manage diabetes.  Take any prescribed medicines to control diabetes as directed by your health care provider.  Control your hypertension.  Making food choices that are low in salt (sodium), saturated fat, trans fat, and cholesterol is recommended to manage hypertension.  Take any prescribed medicines to control hypertension as directed by your health care provider.  Maintain a healthy weight.  Reducing calorie intake and making food choices that are low in sodium, saturated fat, trans fat, and cholesterol are recommended to manage weight.  Stop drug abuse.  Avoid taking birth control pills.  Talk to your health care provider about the risks of taking birth control pills if you are over 35 years old, smoke, get migraines, or have ever had a blood clot.  Get evaluated for sleep  disorders (sleep apnea).  Talk to your health care provider about getting a sleep evaluation if you snore a lot or have excessive sleepiness.  Take medicines only as directed by your health care provider.  For some people, aspirin or blood thinners (anticoagulants) are helpful in reducing the risk of forming abnormal blood clots that can lead to stroke. If you have the irregular heart rhythm of atrial fibrillation, you should be on a blood thinner unless there is a good reason you cannot take them.  Understand all your medicine instructions.  Make sure that other conditions (such as anemia or atherosclerosis) are addressed. SEEK IMMEDIATE MEDICAL CARE IF:   You have sudden weakness or numbness of the face, arm, or leg, especially on one side of the body.  Your face or eyelid droops to one side.  You have sudden confusion.  You have trouble speaking (aphasia) or understanding.  You have sudden trouble seeing in one or both eyes.  You have sudden trouble walking.  You have dizziness.  You have a loss of balance or coordination.  You have a sudden, severe headache with no known cause.  You have new chest pain or an irregular heartbeat. Any of these symptoms may represent a serious problem that is an emergency. Do not wait to see if the symptoms will go away. Get medical help at once. Call your local emergency services (911 in [place] Do not drive yourself to the hospital. Document Released: 10/10/2004 Document Revised: 01/17/2014 Document Reviewed: 03/05/2013 [employer] Patient Information 2015 [employer]. This information is not intended to replace advice given   to you by your health care provider. Make sure you discuss any questions you have with your health care provider.  

## 2015-02-03 NOTE — Progress Notes (Signed)
Established Carotid Patient   History of Present Illness  Todd Poole is a 74 y.o. male patient of Dr. Oneida Alar who is status post right carotid stenting December 03, 2013. He returns for followup today. He is on Plavix and aspirin.  The patient has no history of TIA or stroke symptoms, specifically the patient denies a history of amaurosis fugax or monocular blindness, denies a history unilateral of facial drooping, denies a history of hemiplegia, and denies a history of receptive or expressive aphasia.   The patient denies claudication symptoms in his legs with walking. He reports losing a few pounds intentionally.  The patient denies New Medical or Surgical History.  Pt Diabetic: "borderline" Pt smoker: former smoker, quit in 2003, states he was a heavy smoker for many years  Pt meds include: Statin : Yes ASA: Yes Other anticoagulants/antiplatelets: Plavix  Past Medical History  Diagnosis Date  . COPD (chronic obstructive pulmonary disease)   . Arthritis   . Melanoma   . Hypertension   . Diabetes mellitus without complication   . Carotid artery occlusion     Social History History  Substance Use Topics  . Smoking status: Former Smoker -- 3.00 packs/day for 50 years    Types: Cigarettes    Quit date: 09/17/2007  . Smokeless tobacco: Never Used  . Alcohol Use: No    Family History Family History  Problem Relation Age of Onset  . Dementia Father   . Kidney disease Father     Kidney Stones    Surgical History Past Surgical History  Procedure Laterality Date  . Tonsillectomy    . Carotid stent Right December 03, 2013  . Carotid angiogram Bilateral 11/09/2013    Procedure: CAROTID ANGIOGRAM;  Surgeon: Serafina Mitchell, MD;  Location: [facility] CATH LAB;  Service: Cardiovascular;  Laterality: Bilateral;  . Carotid stent insertion Right 12/03/2013    Procedure: CAROTID STENT INSERTION;  Surgeon: Elam Dutch, MD;  Location: [facility] CATH LAB;  Service: Cardiovascular;   Laterality: Right;    No Known Allergies  Current Outpatient Prescriptions  Medication Sig Dispense Refill  . aspirin 81 MG tablet Take 81 mg by mouth daily.    . clopidogrel (PLAVIX) 75 MG tablet TAKE ONE TABLET BY MOUTH ONCE DAILY 30 tablet 6  . Fluticasone-Salmeterol (ADVAIR) 250-50 MCG/DOSE AEPB Inhale 1 puff into the lungs 2 (two) times daily.     . hydrochlorothiazide (HYDRODIURIL) 25 MG tablet TAKE ONE TABLET BY MOUTH ONCE DAILY 30 tablet 3  . simvastatin (ZOCOR) 5 MG tablet TAKE ONE TABLET BY MOUTH AT BEDTIME 90 tablet 0   No current facility-administered medications for this visit.    Review of Systems : See HPI for pertinent positives and negatives.  Physical Examination  Filed Vitals:   02/03/15 1324 02/03/15 1333  BP: 130/70 118/75  Pulse: 103 103  Resp:  18  Height:  5' 6.5" (1.689 m)  Weight:  199 lb (90.266 kg)  SpO2:  97%   Body mass index is 31.64 kg/(m^2).   General: WDWN obese male in NAD, hygenienically challenged.  GAIT: normal Eyes: PERRLA Pulmonary: Non-labored, CTAB, Negative Rales, Negative rhonchi, & Negative wheezing.  Cardiac: regular Rhythm, no detected murmur.  VASCULAR EXAM Carotid Bruits Right Left   Negative Negative   Aorta is not palpable. Radial pulses are 2+ palpable and equal.      LE Pulses Right Left   POPLITEAL not palpable  not palpable   POSTERIOR TIBIAL  palpable   palpable  DORSALIS PEDIS  ANTERIOR TIBIAL  palpable   palpable     Gastrointestinal: soft, nontender, BS WNL, no r/g, No palpated masses.  Musculoskeletal: Negative muscle atrophy/wasting. M/S 5/5 throughout, Extremities without ischemic changes.  Neurologic: A&O X 3; Appropriate Affect,  Speech is normal CN 2-12 intact, Pain and light touch intact in extremities, Motor  exam as listed above.         Non-Invasive Vascular Imaging CAROTID DUPLEX 02/03/2015   CEREBROVASCULAR DUPLEX EVALUATION     INDICATION: Carotid artery disease; evaluation status post stent placement.    PREVIOUS INTERVENTION(S): Right internal carotid artery stent placed 12/03/2013    DUPLEX EXAM:     RIGHT  LEFT  Peak Systolic Velocities (cm/s) End Diastolic Velocities (cm/s) Plaque LOCATION Peak Systolic Velocities (cm/s) End Diastolic Velocities (cm/s) Plaque  57 10 HT CCA PROXIMAL 107 25   75 15 HT CCA MID 183 43   112 20 HT CCA DISTAL 177 34   137 8 - ECA 121 22   - - - ICA PROXIMAL 224 48   - - - ICA MID 191 57   - - - ICA DISTAL 117 34     N/A ICA / CCA Ratio (PSV) 1.2  Antegrade Vertebral Flow Antegrade  664 Brachial Systolic Pressure (mmHg) 403  Triphasic Brachial Artery Waveforms Triphasic    Peak Systolic Velocities (cm/s) End Diastolic Velocities (cm/s) Plaque STENT (  ) Peak Systolic Velocities (cm/s) End Diastolic Velocities (cm/s) Plaque  128 36038 - PROXIMAL     122 37 - MID     123  - DISTAL       Plaque Morphology:  HM = Homogeneous, HT = Heterogeneous, CP = Calcific Plaque, SP = Smooth Plaque, IP = Irregular Plaque    ADDITIONAL FINDINGS:     IMPRESSION: 1. Patent right internal carotid artery stent without evidence for restenosis. 2. 50 - 69% left internal carotid artery stenosis, however the artery is noted to be tortuous, Increased velocity of greater than 50% in the common carotid artery may decrease velocity in the proximal internal carotid artery.    Compared to the previous exam:  No significant change     Assessment: Todd Poole is a 74 y.o. male who is s/p right internal carotid artery stent placement on 12/03/2013. He has no history of stroke or TIA. Today's carotid Duplex suggests a patent right internal carotid artery stent without evidence for restenosis. 50 - 69% left internal carotid artery stenosis, however the artery is  noted to be tortuous, Increased velocity of greater than 50% in the common carotid artery may decrease velocity in the proximal internal carotid artery. No significant change from Duplex on 07/07/14.   Plan: Follow-up in 6 months with Carotid Duplex.   I discussed in depth with the patient the nature of atherosclerosis, and emphasized the importance of maximal medical management including strict control of blood pressure, blood glucose, and lipid levels, obtaining regular exercise, and continued cessation of smoking.  The patient is aware that without maximal medical management the underlying atherosclerotic disease process will progress, limiting the benefit of any interventions. The patient was given information about stroke prevention and what symptoms should prompt the patient to seek immediate medical care. Thank you for allowing [place] to participate in this patient's care.  Clemon Chambers, RN, MSN, FNP-C [facility] Office: [phone]  Clinic Physician: Bridgett Larsson  02/03/2015 1:27 PM

## 2015-02-08 DIAGNOSIS — N183 Chronic kidney disease, stage 3 (moderate): Secondary | ICD-10-CM | POA: Diagnosis not present

## 2015-02-08 DIAGNOSIS — Z0000 Encounter for general adult medical examination without abnormal findings: Secondary | ICD-10-CM | POA: Diagnosis not present

## 2015-02-08 DIAGNOSIS — I10 Essential (primary) hypertension: Secondary | ICD-10-CM | POA: Diagnosis not present

## 2015-02-08 DIAGNOSIS — E119 Type 2 diabetes mellitus without complications: Secondary | ICD-10-CM | POA: Diagnosis not present

## 2015-02-08 DIAGNOSIS — Z1211 Encounter for screening for malignant neoplasm of colon: Secondary | ICD-10-CM | POA: Diagnosis not present

## 2015-02-08 DIAGNOSIS — E785 Hyperlipidemia, unspecified: Secondary | ICD-10-CM | POA: Diagnosis not present

## 2015-02-08 DIAGNOSIS — J449 Chronic obstructive pulmonary disease, unspecified: Secondary | ICD-10-CM | POA: Diagnosis not present

## 2015-02-09 DIAGNOSIS — J449 Chronic obstructive pulmonary disease, unspecified: Secondary | ICD-10-CM | POA: Diagnosis not present

## 2015-02-09 DIAGNOSIS — R918 Other nonspecific abnormal finding of lung field: Secondary | ICD-10-CM | POA: Diagnosis not present

## 2015-02-24 DIAGNOSIS — J90 Pleural effusion, not elsewhere classified: Secondary | ICD-10-CM | POA: Diagnosis not present

## 2015-02-24 DIAGNOSIS — C7801 Secondary malignant neoplasm of right lung: Secondary | ICD-10-CM | POA: Diagnosis not present

## 2015-02-27 ENCOUNTER — Telehealth: Payer: Self-pay | Admitting: *Deleted

## 2015-02-27 NOTE — Telephone Encounter (Signed)
Oncology Nurse Navigator Documentation  Oncology Nurse Navigator Flowsheets 02/27/2015  Referral date to RadOnc/MedOnc 02/27/2015  Navigator Encounter Type Introductory phone call  Treatment Phase Other/Called to set up for thoracic clinic.  I was unable to leave a vm message so I called emergency contact and left a vm message to call.  I left my name and phone message.   Time Spent with Patient 15

## 2015-02-28 ENCOUNTER — Telehealth: Payer: Self-pay | Admitting: *Deleted

## 2015-02-28 NOTE — Telephone Encounter (Signed)
Oncology Nurse Navigator Documentation  Oncology Nurse Navigator Flowsheets 02/28/2015  Navigator Encounter Type Introductory phone call  Treatment Phase Other/Pre-tx  Coordination of Care MD Appointments/Called to set up appt for thoracic clinic, no answer  Time Spent with Patient 15

## 2015-03-01 ENCOUNTER — Telehealth: Payer: Self-pay | Admitting: *Deleted

## 2015-03-01 NOTE — Telephone Encounter (Signed)
Oncology Nurse Navigator Documentation  Oncology Nurse Navigator Flowsheets 03/01/2015  Navigator Encounter Type Other/Phone call   Treatment Phase Other/Pre Tx  Coordination of Care MD Appointments/Called to set up for thoracic clinic on 03/09/15.  I was unable to leave a vm message on patient's phone.  I called patient's emergency contact and left a message to call   Time Spent with Patient 15

## 2015-03-03 ENCOUNTER — Telehealth: Payer: Self-pay | Admitting: Internal Medicine

## 2015-03-03 NOTE — Telephone Encounter (Signed)
new patient appt-s/w patient and gave np appt for 06/28 @ 1:45 w/Dr. Julien Nordmann Referring Dr. Shan Levans   Dx- Lung/Liver

## 2015-03-13 ENCOUNTER — Other Ambulatory Visit: Payer: Self-pay | Admitting: Medical Oncology

## 2015-03-13 DIAGNOSIS — R918 Other nonspecific abnormal finding of lung field: Secondary | ICD-10-CM

## 2015-03-14 ENCOUNTER — Ambulatory Visit (HOSPITAL_BASED_OUTPATIENT_CLINIC_OR_DEPARTMENT_OTHER): Payer: Medicare Other | Admitting: Internal Medicine

## 2015-03-14 ENCOUNTER — Other Ambulatory Visit (HOSPITAL_BASED_OUTPATIENT_CLINIC_OR_DEPARTMENT_OTHER): Payer: Medicare Other

## 2015-03-14 ENCOUNTER — Encounter: Payer: Self-pay | Admitting: Internal Medicine

## 2015-03-14 ENCOUNTER — Ambulatory Visit: Payer: Medicare Other

## 2015-03-14 ENCOUNTER — Telehealth: Payer: Self-pay | Admitting: Internal Medicine

## 2015-03-14 VITALS — BP 123/79 | HR 92 | Temp 98.0°F | Resp 18 | Ht 66.5 in | Wt 193.5 lb

## 2015-03-14 DIAGNOSIS — R918 Other nonspecific abnormal finding of lung field: Secondary | ICD-10-CM | POA: Insufficient documentation

## 2015-03-14 LAB — COMPREHENSIVE METABOLIC PANEL (CC13)
ALBUMIN: 3.1 g/dL — AB (ref 3.5–5.0)
ALT: 20 U/L (ref 0–55)
AST: 18 U/L (ref 5–34)
Alkaline Phosphatase: 74 U/L (ref 40–150)
Anion Gap: 9 mEq/L (ref 3–11)
BILIRUBIN TOTAL: 0.38 mg/dL (ref 0.20–1.20)
BUN: 12.9 mg/dL (ref 7.0–26.0)
CHLORIDE: 107 meq/L (ref 98–109)
CO2: 28 meq/L (ref 22–29)
CREATININE: 1.4 mg/dL — AB (ref 0.7–1.3)
Calcium: 9.1 mg/dL (ref 8.4–10.4)
EGFR: 50 mL/min/{1.73_m2} — AB (ref >90)
Glucose: 111 mg/dl (ref 70–140)
Potassium: 3.2 mEq/L — ABNORMAL LOW (ref 3.5–5.1)
SODIUM: 144 meq/L (ref 136–145)
TOTAL PROTEIN: 6.4 g/dL (ref 6.4–8.3)

## 2015-03-14 LAB — CBC WITH DIFFERENTIAL/PLATELET
BASO%: 0.3 % (ref 0.0–2.0)
Basophils Absolute: 0 10*3/uL (ref 0.0–0.1)
EOS%: 4.9 % (ref 0.0–7.0)
Eosinophils Absolute: 0.5 10*3/uL (ref 0.0–0.5)
HEMATOCRIT: 43.3 % (ref 38.4–49.9)
HGB: 14.6 g/dL (ref 13.0–17.1)
LYMPH#: 1.6 10*3/uL (ref 0.9–3.3)
LYMPH%: 16.4 % (ref 14.0–49.0)
MCH: 31.5 pg (ref 27.2–33.4)
MCHC: 33.7 g/dL (ref 32.0–36.0)
MCV: 93.3 fL (ref 79.3–98.0)
MONO#: 0.6 10*3/uL (ref 0.1–0.9)
MONO%: 5.8 % (ref 0.0–14.0)
NEUT#: 6.9 10*3/uL — ABNORMAL HIGH (ref 1.5–6.5)
NEUT%: 72.6 % (ref 39.0–75.0)
Platelets: 248 10*3/uL (ref 140–400)
RBC: 4.64 10*6/uL (ref 4.20–5.82)
RDW: 13.2 % (ref 11.0–14.6)
WBC: 9.5 10*3/uL (ref 4.0–10.3)

## 2015-03-14 NOTE — Progress Notes (Signed)
Quick Note:  Call patient with the result and Encourage K Rich diet. ______

## 2015-03-14 NOTE — Telephone Encounter (Signed)
Gave and printed appt sched and avs for opt for July

## 2015-03-14 NOTE — Progress Notes (Signed)
[place] [phone]   [phone]  CONSULT NOTE  REFERRING PHYSICIAN:Teresa Leone Haven, NP  REASON FOR CONSULTATION:  74 years old white male with questionable lung cancer  HPI Todd Poole is a 74 y.o. male with past medical history significant for COPD, diabetes mellitus, peripheral arterial disease, osteoarthritis, carotid artery stenosis as well as long history of heavy smoking but quit 7 years ago. The patient was seen a few weeks ago by his primary care physician for routine evaluation and was complaining of shortness of breath secondary to his COPD. Chest x-ray was performed and it showed questionable right lung mass. This was followed by CT scan of the chest with contrast on 02/24/2015 and it showed abnormal soft tissue present in the medial right lower lobe measuring 3.2 x 6.4 cm. It extends into the hilar and subcarinal regions. There was several satellite nodules with multiple additional nodules present in the right upper and lower lobes. There was also 1.4 cm nodule present medially in the right lower lobe and several additional subpleural nodules isn't on the right more inferiorly. There was a subpleural 1.1 cm right liver lobe lesion and a right retrocrural lymph node measuring 0.7 cm. Ms. Todd Poole kindly referred the patient to me today for evaluation and recommendation regarding these scan abnormalities. When seen today the patient continues to complain of baseline shortness of breath that was getting worse as well as cough productive of whitish sputum but no significant chest pain or hemoptysis. He also complaining of back pain and 40 pounds weight loss over the last 12 months. He denied having any significant night sweats. He denied having any headache or visual changes. Family history significant for a father with kidney stone and dementia. He was raised by his stepmother and doesn't know much about his biologic mother. The patient  is divorced and has 5 children. He used to work in a newspaper and also delivering medical supplies. He has a history of smoking up to 3 packs per day for around 55 years but quit 7 years ago. He has no history of alcohol or drug abuse.  HPI  Past Medical History  Diagnosis Date  . COPD (chronic obstructive pulmonary disease)   . Melanoma   . Hypertension   . Diabetes mellitus without complication   . Carotid artery occlusion   . Arthritis     Lower Back    Past Surgical History  Procedure Laterality Date  . Tonsillectomy      at age 89 yrs old  . Carotid stent Right December 03, 2013  . Carotid angiogram Bilateral 11/09/2013    Procedure: CAROTID ANGIOGRAM;  Surgeon: Serafina Mitchell, MD;  Location: [facility] CATH LAB;  Service: Cardiovascular;  Laterality: Bilateral;  . Carotid stent insertion Right 12/03/2013    Procedure: CAROTID STENT INSERTION;  Surgeon: Elam Dutch, MD;  Location: [facility];  Service: Cardiovascular;  Laterality: Right;    Family History  Problem Relation Age of Onset  . Dementia Father   . Kidney disease Father     Kidney Stones    Social History History  Substance Use Topics  . Smoking status: Former Smoker -- 3.00 packs/day for 50 years    Types: Cigarettes    Quit date: 09/17/2007  . Smokeless tobacco: Never Used  . Alcohol Use: No    No Known Allergies  Current Outpatient Prescriptions  Medication Sig Dispense Refill  . aspirin 81 MG tablet Take 81 mg by  mouth daily.    . clopidogrel (PLAVIX) 75 MG tablet TAKE ONE TABLET BY MOUTH ONCE DAILY 30 tablet 6  . hydrochlorothiazide (HYDRODIURIL) 25 MG tablet TAKE ONE TABLET BY MOUTH ONCE DAILY 30 tablet 3  . simvastatin (ZOCOR) 5 MG tablet TAKE ONE TABLET BY MOUTH AT BEDTIME 90 tablet 0  . SYMBICORT 160-4.5 MCG/ACT inhaler Inhale 1 puff into the lungs daily.    . VENTOLIN HFA 108 (90 BASE) MCG/ACT inhaler as needed.     No current facility-administered medications for this visit.    Review of  Systems  Constitutional: positive for anorexia, fatigue and weight loss Eyes: negative Ears, nose, mouth, throat, and face: negative Respiratory: positive for cough, dyspnea on exertion and sputum Cardiovascular: negative Gastrointestinal: negative Genitourinary:negative Integument/breast: negative Hematologic/lymphatic: negative Musculoskeletal:positive for back pain Neurological: negative Behavioral/Psych: negative Endocrine: negative Allergic/Immunologic: negative  Physical Exam  SAY:TKZSW, healthy, no distress, well developed and malnourished SKIN: skin color, texture, turgor are normal, no rashes or significant lesions HEAD: Normocephalic, No masses, lesions, tenderness or abnormalities EYES: normal, PERRLA, Conjunctiva are pink and non-injected EARS: External ears normal, Canals clear OROPHARYNX:no exudate, no erythema and lips, buccal mucosa, and tongue normal  NECK: supple, no adenopathy, no JVD LYMPH:  no palpable lymphadenopathy, no hepatosplenomegaly LUNGS: clear to auscultation , and palpation HEART: regular rate & rhythm and no murmurs ABDOMEN:abdomen soft, non-tender, normal bowel sounds and no masses or organomegaly BACK: Back symmetric, no curvature., No CVA tenderness EXTREMITIES:no joint deformities, effusion, or inflammation, no edema, no skin discoloration  NEURO: alert & oriented x 3 with fluent speech, no focal motor/sensory deficits  PERFORMANCE STATUS: ECOG 1  LABORATORY DATA: Lab Results  Component Value Date   WBC 9.5 03/14/2015   HGB 14.6 03/14/2015   HCT 43.3 03/14/2015   MCV 93.3 03/14/2015   PLT 248 03/14/2015      Chemistry      Component Value Date/Time   NA 144 03/14/2015 1355   NA 141 12/27/2013 1132   K 3.2* 03/14/2015 1355   K 3.6 12/27/2013 1132   CL 102 12/27/2013 1132   CO2 28 03/14/2015 1355   CO2 28 12/27/2013 1132   BUN 12.9 03/14/2015 1355   BUN 20 12/27/2013 1132   CREATININE 1.4* 03/14/2015 1355   CREATININE 1.73*  12/27/2013 1132   CREATININE 1.85* 12/04/2013 0259      Component Value Date/Time   CALCIUM 9.1 03/14/2015 1355   CALCIUM 9.4 12/27/2013 1132   ALKPHOS 74 03/14/2015 1355   ALKPHOS 46 12/27/2013 1337   AST 18 03/14/2015 1355   AST 23 12/27/2013 1337   ALT 20 03/14/2015 1355   ALT 26 12/27/2013 1337   BILITOT 0.38 03/14/2015 1355   BILITOT 0.8 12/27/2013 1337       RADIOGRAPHIC STUDIES: No results found.  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   PLAN: I had a lengthy discussion with the patient today about his current condition and further investigation to confirm his diagnosis. I will complete the staging workup by ordering a PET scan as well as MRI of the brain. I will also refer the patient to interventional radiology for consideration of ultrasound guided core biopsy of the liver lesion. I will  see the patient back for follow-up visit in 2 weeks for reevaluation and more detailed discussion of his treatment options based on the final staging workup and biopsy results. He was advised to call immediately if he has any concerning symptoms in the interval. The patient voices understanding of current disease status and treatment options and is in agreement with the current care plan.  All questions were answered. The patient knows to call the clinic with any problems, questions or concerns. We can certainly see the patient much sooner if necessary.  Thank you so much for allowing me to participate in the care of [name]. I will continue to follow up the patient with you and assist in his care.  I spent 40 minutes counseling the patient face to face. The total time  spent in the appointment was 60 minutes.  Disclaimer: This note was dictated with voice recognition software. Similar sounding words can inadvertently be transcribed and may not be corrected upon review.   Corissa Oguinn K. March 14, 2015, 3:22 PM

## 2015-03-16 ENCOUNTER — Telehealth: Payer: Self-pay | Admitting: Medical Oncology

## 2015-03-16 NOTE — Telephone Encounter (Addendum)
Called pt phone number and not available or able to leave message. Mailed sheet on K rich diet to pt.

## 2015-03-16 NOTE — Telephone Encounter (Signed)
-----   Message from Curt Bears, MD sent at 03/14/2015  3:36 PM EDT ----- Call patient with the result and Encourage K Rich diet.

## 2015-03-21 ENCOUNTER — Telehealth: Payer: Self-pay | Admitting: Internal Medicine

## 2015-03-21 ENCOUNTER — Inpatient Hospital Stay
Admission: RE | Admit: 2015-03-21 | Discharge: 2015-03-21 | Disposition: A | Discharge status: Home / Self Care | Payer: Self-pay | Source: Ambulatory Visit | Attending: Diagnostic Radiology | Admitting: Diagnostic Radiology

## 2015-03-21 ENCOUNTER — Other Ambulatory Visit (HOSPITAL_COMMUNITY): Payer: Self-pay | Admitting: Diagnostic Radiology

## 2015-03-21 ENCOUNTER — Ambulatory Visit (HOSPITAL_COMMUNITY)
Admission: RE | Admit: 2015-03-21 | Discharge: 2015-03-21 | Disposition: A | Discharge status: Home / Self Care | Payer: Medicare Other | Source: Ambulatory Visit | Attending: Internal Medicine | Admitting: Internal Medicine

## 2015-03-21 DIAGNOSIS — Z85820 Personal history of malignant melanoma of skin: Secondary | ICD-10-CM | POA: Insufficient documentation

## 2015-03-21 DIAGNOSIS — C7801 Secondary malignant neoplasm of right lung: Secondary | ICD-10-CM | POA: Insufficient documentation

## 2015-03-21 DIAGNOSIS — R918 Other nonspecific abnormal finding of lung field: Secondary | ICD-10-CM | POA: Insufficient documentation

## 2015-03-21 DIAGNOSIS — C778 Secondary and unspecified malignant neoplasm of lymph nodes of multiple regions: Secondary | ICD-10-CM

## 2015-03-21 DIAGNOSIS — C439 Malignant melanoma of skin, unspecified: Secondary | ICD-10-CM

## 2015-03-21 DIAGNOSIS — C7971 Secondary malignant neoplasm of right adrenal gland: Secondary | ICD-10-CM

## 2015-03-21 DIAGNOSIS — C7989 Secondary malignant neoplasm of other specified sites: Secondary | ICD-10-CM | POA: Diagnosis not present

## 2015-03-21 DIAGNOSIS — C787 Secondary malignant neoplasm of liver and intrahepatic bile duct: Secondary | ICD-10-CM | POA: Diagnosis not present

## 2015-03-21 DIAGNOSIS — C7951 Secondary malignant neoplasm of bone: Secondary | ICD-10-CM | POA: Insufficient documentation

## 2015-03-21 DIAGNOSIS — J449 Chronic obstructive pulmonary disease, unspecified: Secondary | ICD-10-CM

## 2015-03-21 DIAGNOSIS — C801 Malignant (primary) neoplasm, unspecified: Secondary | ICD-10-CM | POA: Diagnosis not present

## 2015-03-21 LAB — GLUCOSE, CAPILLARY: Glucose-Capillary: 88 mg/dL (ref 65–99)

## 2015-03-21 MED ORDER — FLUDEOXYGLUCOSE F - 18 (FDG) INJECTION
8.7000 | Freq: Once | INTRAVENOUS | Status: AC | PRN
  Administered 2015-03-21: 8.7 via INTRAVENOUS

## 2015-03-21 NOTE — Telephone Encounter (Signed)
Faxed pt medical records to [facility] °

## 2015-03-22 ENCOUNTER — Telehealth (HOSPITAL_COMMUNITY): Payer: Self-pay

## 2015-03-22 NOTE — Telephone Encounter (Signed)
-----   Message from Curt Bears, MD sent at 03/22/2015 11:48 AM EDT ----- Regarding: RE: US Biopsy Will do. Stanton Kidney, Would you please call him. Thank you. ----- Message -----    From: Alecia Lemming    Sent: 03/22/2015  10:00 AM      To: Curt Bears, MD Subject: US Biopsy                                      Your patient Todd Poole is schedule for biopsy on July 13, pt is on Plavix need to hold 5 days prior.. Can someone from your office call patient have him hold blood thinners so he can have biopsy.Thanks

## 2015-03-22 NOTE — Telephone Encounter (Signed)
Per MD attempt to reach pt, no answer, unable to leave message as voice mail is not set up. Attempt to reach Emergency contact Earley Favor, lmovm for pt to please call office regarding medication management prior to procedure. Message for pt upon return call : Pt to hold Plavix 5 days prior to Biopsy Jul 13

## 2015-03-23 ENCOUNTER — Other Ambulatory Visit: Payer: Self-pay | Admitting: Vascular Surgery

## 2015-03-23 ENCOUNTER — Telehealth: Payer: Self-pay | Admitting: Internal Medicine

## 2015-03-23 ENCOUNTER — Telehealth: Payer: Self-pay | Admitting: *Deleted

## 2015-03-23 NOTE — Telephone Encounter (Signed)
Instructed pt to stop Plavix 7/8 resuming 7/14 per MD. POF to scheduling to move Lab/MD appt to after US biopsy

## 2015-03-23 NOTE — Telephone Encounter (Signed)
s.w. pt advised on 7.13 cx and moved to 7.21 due to BX....pt ok and aware

## 2015-03-24 ENCOUNTER — Ambulatory Visit (HOSPITAL_COMMUNITY)
Admission: RE | Admit: 2015-03-24 | Discharge: 2015-03-24 | Disposition: A | Discharge status: Home / Self Care | Payer: Medicare Other | Source: Ambulatory Visit | Attending: Internal Medicine | Admitting: Internal Medicine

## 2015-03-24 DIAGNOSIS — C801 Malignant (primary) neoplasm, unspecified: Secondary | ICD-10-CM | POA: Diagnosis not present

## 2015-03-24 DIAGNOSIS — C7801 Secondary malignant neoplasm of right lung: Secondary | ICD-10-CM | POA: Diagnosis not present

## 2015-03-24 DIAGNOSIS — R918 Other nonspecific abnormal finding of lung field: Secondary | ICD-10-CM

## 2015-03-24 DIAGNOSIS — C439 Malignant melanoma of skin, unspecified: Secondary | ICD-10-CM | POA: Diagnosis not present

## 2015-03-24 DIAGNOSIS — C787 Secondary malignant neoplasm of liver and intrahepatic bile duct: Secondary | ICD-10-CM | POA: Diagnosis not present

## 2015-03-24 DIAGNOSIS — C7989 Secondary malignant neoplasm of other specified sites: Secondary | ICD-10-CM | POA: Diagnosis not present

## 2015-03-24 DIAGNOSIS — C7931 Secondary malignant neoplasm of brain: Secondary | ICD-10-CM | POA: Diagnosis not present

## 2015-03-24 DIAGNOSIS — C719 Malignant neoplasm of brain, unspecified: Secondary | ICD-10-CM

## 2015-03-24 DIAGNOSIS — Z85820 Personal history of malignant melanoma of skin: Secondary | ICD-10-CM | POA: Diagnosis not present

## 2015-03-24 DIAGNOSIS — C778 Secondary and unspecified malignant neoplasm of lymph nodes of multiple regions: Secondary | ICD-10-CM | POA: Diagnosis not present

## 2015-03-24 DIAGNOSIS — C7951 Secondary malignant neoplasm of bone: Secondary | ICD-10-CM | POA: Diagnosis not present

## 2015-03-24 DIAGNOSIS — C7971 Secondary malignant neoplasm of right adrenal gland: Secondary | ICD-10-CM | POA: Diagnosis not present

## 2015-03-24 HISTORY — DX: Malignant neoplasm of brain, unspecified: C71.9

## 2015-03-24 MED ORDER — GADOBENATE DIMEGLUMINE 529 MG/ML IV SOLN
20.0000 mL | Freq: Once | INTRAVENOUS | Status: AC | PRN
  Administered 2015-03-24: 18 mL via INTRAVENOUS

## 2015-03-27 ENCOUNTER — Other Ambulatory Visit: Payer: Self-pay | Admitting: Radiology

## 2015-03-28 ENCOUNTER — Ambulatory Visit: Payer: Medicare Other | Admitting: Oncology

## 2015-03-28 ENCOUNTER — Ambulatory Visit (HOSPITAL_COMMUNITY): Payer: Medicare Other

## 2015-03-28 ENCOUNTER — Other Ambulatory Visit: Payer: Medicare Other

## 2015-03-29 ENCOUNTER — Encounter (HOSPITAL_COMMUNITY): Payer: Self-pay

## 2015-03-29 ENCOUNTER — Ambulatory Visit (HOSPITAL_COMMUNITY)
Admission: RE | Admit: 2015-03-29 | Discharge: 2015-03-29 | Disposition: A | Discharge status: Home / Self Care | Payer: Medicare Other | Source: Ambulatory Visit | Attending: Internal Medicine | Admitting: Internal Medicine

## 2015-03-29 ENCOUNTER — Other Ambulatory Visit: Payer: Self-pay | Admitting: Internal Medicine

## 2015-03-29 DIAGNOSIS — K7689 Other specified diseases of liver: Secondary | ICD-10-CM | POA: Insufficient documentation

## 2015-03-29 DIAGNOSIS — C787 Secondary malignant neoplasm of liver and intrahepatic bile duct: Secondary | ICD-10-CM | POA: Diagnosis not present

## 2015-03-29 DIAGNOSIS — R918 Other nonspecific abnormal finding of lung field: Secondary | ICD-10-CM

## 2015-03-29 DIAGNOSIS — C801 Malignant (primary) neoplasm, unspecified: Secondary | ICD-10-CM | POA: Diagnosis not present

## 2015-03-29 DIAGNOSIS — K769 Liver disease, unspecified: Secondary | ICD-10-CM | POA: Insufficient documentation

## 2015-03-29 LAB — BASIC METABOLIC PANEL
Anion gap: 10 (ref 5–15)
BUN: 15 mg/dL (ref 6–20)
CO2: 23 mmol/L (ref 22–32)
CREATININE: 1.54 mg/dL — AB (ref 0.61–1.24)
Calcium: 9.1 mg/dL (ref 8.9–10.3)
Chloride: 105 mmol/L (ref 101–111)
GFR, EST AFRICAN AMERICAN: 49 mL/min — AB (ref >60)
GFR, EST NON AFRICAN AMERICAN: 42 mL/min — AB (ref >60)
GLUCOSE: 113 mg/dL — AB (ref 65–99)
Potassium: 3.3 mmol/L — ABNORMAL LOW (ref 3.5–5.1)
Sodium: 138 mmol/L (ref 135–145)

## 2015-03-29 LAB — CBC WITH DIFFERENTIAL/PLATELET
BASOS ABS: 0 10*3/uL (ref 0.0–0.1)
Basophils Relative: 0 % (ref 0–1)
Eosinophils Absolute: 0.9 10*3/uL — ABNORMAL HIGH (ref 0.0–0.7)
Eosinophils Relative: 7 % — ABNORMAL HIGH (ref 0–5)
HCT: 45.6 % (ref 39.0–52.0)
Hemoglobin: 15.5 g/dL (ref 13.0–17.0)
LYMPHS ABS: 1.9 10*3/uL (ref 0.7–4.0)
Lymphocytes Relative: 14 % (ref 12–46)
MCH: 30.6 pg (ref 26.0–34.0)
MCHC: 34 g/dL (ref 30.0–36.0)
MCV: 90.1 fL (ref 78.0–100.0)
Monocytes Absolute: 1.1 10*3/uL — ABNORMAL HIGH (ref 0.1–1.0)
Monocytes Relative: 8 % (ref 3–12)
Neutro Abs: 9.7 10*3/uL — ABNORMAL HIGH (ref 1.7–7.7)
Neutrophils Relative %: 71 % (ref 43–77)
PLATELETS: 255 10*3/uL (ref 150–400)
RBC: 5.06 MIL/uL (ref 4.22–5.81)
RDW: 12.7 % (ref 11.5–15.5)
WBC: 13.6 10*3/uL — ABNORMAL HIGH (ref 4.0–10.5)

## 2015-03-29 LAB — APTT: APTT: 33 s (ref 24–37)

## 2015-03-29 LAB — PROTIME-INR
INR: 1.16 (ref 0.00–1.49)
Prothrombin Time: 15 seconds (ref 11.6–15.2)

## 2015-03-29 MED ORDER — MIDAZOLAM HCL 2 MG/2ML IJ SOLN
INTRAMUSCULAR | Status: DC
Start: 2015-03-29 — End: 2015-03-30
  Filled 2015-03-29: qty 4

## 2015-03-29 MED ORDER — FLUMAZENIL 0.5 MG/5ML IV SOLN
INTRAVENOUS | Status: AC
  Filled 2015-03-29: qty 5

## 2015-03-29 MED ORDER — FENTANYL CITRATE (PF) 100 MCG/2ML IJ SOLN
INTRAMUSCULAR | Status: AC
  Filled 2015-03-29: qty 2

## 2015-03-29 MED ORDER — SODIUM CHLORIDE 0.9 % IV SOLN
INTRAVENOUS | Status: DC
  Administered 2015-03-29: 500 mL via INTRAVENOUS

## 2015-03-29 MED ORDER — FENTANYL CITRATE (PF) 100 MCG/2ML IJ SOLN
INTRAMUSCULAR | Status: AC | PRN
  Administered 2015-03-29: 25 ug via INTRAVENOUS

## 2015-03-29 MED ORDER — NALOXONE HCL 0.4 MG/ML IJ SOLN
INTRAMUSCULAR | Status: AC
  Filled 2015-03-29: qty 1

## 2015-03-29 MED ORDER — MIDAZOLAM HCL 2 MG/2ML IJ SOLN
INTRAMUSCULAR | Status: AC | PRN
Start: 2015-03-29 — End: 2015-03-29
  Administered 2015-03-29: 1 mg via INTRAVENOUS

## 2015-03-29 NOTE — Discharge Instructions (Signed)
Liver Biopsy, Care After Refer to this sheet in the next few weeks. These instructions provide you with information on caring for yourself after your procedure. Your health care provider may also give you more specific instructions. Your treatment has been planned according to current medical practices, but problems sometimes occur. Call your health care provider if you have any problems or questions after your procedure. WHAT TO EXPECT AFTER THE PROCEDURE After your procedure, it is typical to have the following:  A small amount of discomfort in the area where the biopsy was done and in the right shoulder or shoulder blade.  A small amount of bruising around the area where the biopsy was done and on the skin over the liver.  Sleepiness and fatigue for the rest of the day. HOME CARE INSTRUCTIONS   Rest at home for 1-2 days or as directed by your health care provider.  Have a friend or family member stay with you for at least 24 hours.  Because of the medicines used during the procedure, you should not do the following things in the first 24 hours:  Drive.  Use machinery.  Be responsible for the care of other people.  Sign legal documents.  Take a bath or shower.  There are many different ways to close and cover an incision, including stitches, skin glue, and adhesive strips. Follow your health care provider's instructions on:  Incision care.  Bandage (dressing) changes and removal.  Incision closure removal.  Do not drink alcohol in the first week.  Do not lift more than 5 pounds or play contact sports for 2 weeks after this test.  Take medicines only as directed by your health care provider. Do not take medicine containing aspirin or non-steroidal anti-inflammatory medicines such as ibuprofen for 1 week after this test.  It is your responsibility to get your test results. SEEK MEDICAL CARE IF:   You have increased bleeding from an incision that results in more than a  small spot of blood.  You have redness, swelling, or increasing pain in any incisions.  You notice a discharge or a bad smell coming from any of your incisions.  You have a fever or chills. SEEK IMMEDIATE MEDICAL CARE IF:   You develop swelling, bloating, or pain in your abdomen.  You become dizzy or faint.  You develop a rash.  You are nauseous or vomit.  You have difficulty breathing, feel short of breath, or feel faint.  You develop chest pain.  You have problems with your speech or vision.  You have trouble balancing or moving your arms or legs. Document Released: 03/22/2005 Document Revised: 01/17/2014 Document Reviewed: 10/29/2013 [facility] Patient Information 2015 [place], [place]. This information is not intended to replace advice given to you by your health care provider. Make sure you discuss any questions you have with your health care provider. Liver Biopsy The liver is a large organ in the upper right-hand side of your abdomen. A liver biopsy is a procedure in which a tissue sample is taken from the liver and examined under a microscope. The procedure is done to confirm a suspected problem. There are three types of liver biopsies:  Percutaneous. In this type, an incision is made in your abdomen. The sample is removed through the incision with a needle.  Laparoscopic. In this type, several incisions are made in the abdomen. A tiny camera is passed through one of the incisions to help guide the health care provider. The sample is removed through  the other incision or incisions.  Transjugular. In this type, an incision is made in the neck. A tube is passed through the incision to the liver. The sample is removed through the tube with a needle. LET [facility] CARE PROVIDER KNOW ABOUT:  Any allergies you have.  All medicines you are taking, including vitamins, herbs, eye drops, creams, and over-the-counter medicines.  Previous problems you or members of your family  have had with the use of anesthetics.  Any blood disorders you have.  Previous surgeries you have had.  Medical conditions you have.  Possibility of pregnancy, if this applies. RISKS AND COMPLICATIONS Generally, this is a safe procedure. However, problems can occur and include:  Bleeding.  Infection.  Bruising.  Collapsed lung.  Leak of digestive juices (bile) from the liver or gallbladder.  Problems with heart rhythm.  Pain at the biopsy site or in the right shoulder.  Low blood pressure (hypotension).  Injury to nearby organs or tissues. BEFORE THE PROCEDURE  Your health care provider may do some blood or urine tests. These will help your health care provider learn how well your kidneys and liver are working and how well your blood clots.  Ask your health care provider if you will be able to go home the day of the procedure. Arrange for someone to take you home and stay with you for at least 24 hours.  Do not eat or drink anything after midnight on the night before the procedure or as directed by your health care provider.  Ask your health care provider about:  Changing or stopping your regular medicines. This is especially important if you are taking diabetes medicines or blood thinners.  Taking medicines such as aspirin and ibuprofen. These medicines can thin your blood. Do not take these medicines before your procedure if your health care provider asks you not to. PROCEDURE Regardless of the type of biopsy that will be done, you will have an IV line placed. Through this line, you will receive fluids and medicine to relax you. If you will be having a laparoscopic biopsy, you may also receive medicine through this line to make you sleep during the procedure (general anesthetic). Percutaneous Liver Biopsy  You will positioned on your back, with your right hand over your head.  A health care provider will locate your liver by tapping and pressing on the right side of  your abdomen or with the help of an ultrasound machine or CT scan.  An area at the bottom of your last right rib will be numbed.  An incision will be made in the numbed area.  The biopsy needle will be inserted into the incision.  Several samples of liver tissue will be taken with the biopsy needle. You will be asked to hold your breath as each sample is taken. Laparoscopic Liver Biopsy  You will be positioned on your back.  Several small incisions will be made in your abdomen.  Your doctor will pass a tiny camera through one incision. The camera will allow the liver to be viewed on a TV monitor in the operating room.  Tools will be passed through the other incision or incisions. These tools will be used to remove samples of liver tissue. Transjugular Liver Biopsy  You will be positioned on your back on an X-ray table, with your head turned to your left.  An area on your neck just over your jugular vein will be numbed.  An incision will be made in the  numbed area.  A tiny tube will be inserted through the incision. It will be pushed through the jugular vein to a blood vessel in the liver called the hepatic vein.  Dye will be inserted through the tube, and X-rays will be taken. The dye will make the blood vessels in the liver light up on the X-rays.  The biopsy needle will be pushed through the tube until it reaches the liver.  Samples of liver tissue will be taken with the biopsy needle.  The needle and the tube will be removed. After the samples are obtained, the incision or incisions will be closed. AFTER THE PROCEDURE  You will be taken to a recovery area.  You may have to lie on your right side for 1-2 hours. This will prevent bleeding from the biopsy site.  Your progress will be watched. Your blood pressure, pulse, and the biopsy site will be checked often.  You may have some pain or feel sick. If this happens, tell your health care provider.  As you begin to feel  better, you will be offered ice and beverages.  You may be allowed to go home when the medicines have worn off and you can walk, drink, eat, and use the bathroom. Document Released: 11/23/2003 Document Revised: 01/17/2014 Document Reviewed: 10/29/2013 [facility] Patient Information 2015 [place], [place]. This information is not intended to replace advice given to you by your health care provider. Make sure you discuss any questions you have with your health care provider. Conscious Sedation Sedation is the use of medicines to promote relaxation and relieve discomfort and anxiety. Conscious sedation is a type of sedation. Under conscious sedation you are less alert than normal but are still able to respond to instructions or stimulation. Conscious sedation is used during short medical and dental procedures. It is milder than deep sedation or general anesthesia and allows you to return to your regular activities sooner.  LET [facility] CARE PROVIDER KNOW ABOUT:   Any allergies you have.  All medicines you are taking, including vitamins, herbs, eye drops, creams, and over-the-counter medicines.  Use of steroids (by mouth or creams).  Previous problems you or members of your family have had with the use of anesthetics.  Any blood disorders you have.  Previous surgeries you have had.  Medical conditions you have.  Possibility of pregnancy, if this applies.  Use of cigarettes, alcohol, or illegal drugs. RISKS AND COMPLICATIONS Generally, this is a safe procedure. However, as with any procedure, problems can occur. Possible problems include:  Oversedation.  Trouble breathing on your own. You may need to have a breathing tube until you are awake and breathing on your own.  Allergic reaction to any of the medicines used for the procedure. BEFORE THE PROCEDURE  You may have blood tests done. These tests can help show how well your kidneys and liver are working. They can also show how well  your blood clots.  A physical exam will be done.  Only take medicines as directed by your health care provider. You may need to stop taking medicines (such as blood thinners, aspirin, or nonsteroidal anti-inflammatory drugs) before the procedure.   Do not eat or drink at least 6 hours before the procedure or as directed by your health care provider.  Arrange for a responsible adult, family member, or friend to take you home after the procedure. He or she should stay with you for at least 24 hours after the procedure, until the medicine has worn  off. PROCEDURE   An intravenous (IV) catheter will be inserted into one of your veins. Medicine will be able to flow directly into your body through this catheter. You may be given medicine through this tube to help prevent pain and help you relax.  The medical or dental procedure will be done. AFTER THE PROCEDURE  You will stay in a recovery area until the medicine has worn off. Your blood pressure and pulse will be checked.   Depending on the procedure you had, you may be allowed to go home when you can tolerate liquids and your pain is under control. Document Released: 05/28/2001 Document Revised: 09/07/2013 Document Reviewed: 05/10/2013 [facility] Patient Information 2015 [place], [place]. This information is not intended to replace advice given to you by your health care provider. Make sure you discuss any questions you have with your health care provider.

## 2015-03-29 NOTE — Procedures (Signed)
Technically successful US guided biopsy of indeterminate hypoechoic mass in right lobe of liver adjacent to gall bladder.  No immediate complications.

## 2015-03-29 NOTE — H&P (Signed)
Chief Complaint: "I'm having a biopsy"  Referring Physician(s): Mohamed,Mohamed  History of Present Illness: Todd Poole is a 74 y.o. male , prior long term smoker, with PMH significant for melanoma RLE 7 years ago, COPD, peripheral arterial disease, renal insuff., osteoarthritis, carotid artery stenosis, HTN, and DM. The patient was seen a few weeks ago by his primary care physician for routine evaluation and was complaining of shortness of breath secondary to his COPD. Subsequent imaging revealed extensive metastatic disease with a dominant right retrohilar mass as well as hypermetabolic lymph nodes, liver lesions, right adrenal gland, bones, soft tissues. MRI brain revealed left superior cerebellar /left medial parietal lobe mass/nodule. He presents today for US guided liver lesion biopsy.   Past Medical History  Diagnosis Date  . COPD (chronic obstructive pulmonary disease)   . Melanoma   . Hypertension   . Diabetes mellitus without complication   . Carotid artery occlusion   . Arthritis     Lower Back    Past Surgical History  Procedure Laterality Date  . Tonsillectomy      at age 67 yrs old  . Carotid stent Right December 03, 2013  . Carotid angiogram Bilateral 11/09/2013    Procedure: CAROTID ANGIOGRAM;  Surgeon: Serafina Mitchell, MD;  Location: [facility] CATH LAB;  Service: Cardiovascular;  Laterality: Bilateral;  . Carotid stent insertion Right 12/03/2013    Procedure: CAROTID STENT INSERTION;  Surgeon: Elam Dutch, MD;  Location: [facility] CATH LAB;  Service: Cardiovascular;  Laterality: Right;    Allergies: Review of patient's allergies indicates no known allergies.  Medications: Prior to Admission medications   Medication Sig Start Date End Date Taking? Authorizing Provider  aspirin 81 MG tablet Take 81 mg by mouth daily.    Historical Provider, MD  clopidogrel (PLAVIX) 75 MG tablet TAKE ONE TABLET BY MOUTH ONCE DAILY 03/23/15   Elam Dutch, MD  hydrochlorothiazide  (HYDRODIURIL) 25 MG tablet TAKE ONE TABLET BY MOUTH ONCE DAILY 05/31/14   [name], MD  simvastatin (ZOCOR) 5 MG tablet TAKE ONE TABLET BY MOUTH AT BEDTIME 08/09/14   [name], MD  SYMBICORT 160-4.5 MCG/ACT inhaler Inhale 1 puff into the lungs daily. 02/08/15   Historical Provider, MD  VENTOLIN HFA 108 (90 BASE) MCG/ACT inhaler Inhale 2 puffs into the lungs every 6 (six) hours as needed for wheezing or shortness of breath.  11/15/14   Historical Provider, MD     Family History  Problem Relation Age of Onset  . Dementia Father   . Kidney disease Father     Kidney Stones    History   Social History  . Marital Status: Divorced    Spouse Name: N/A  . Number of Children: 5  . Years of Education: N/A   Social History Main Topics  . Smoking status: Former Smoker -- 3.00 packs/day for 50 years    Types: Cigarettes    Quit date: 09/17/2007  . Smokeless tobacco: Never Used  . Alcohol Use: No  . Drug Use: No  . Sexual Activity: Not Currently   Other Topics Concern  . None   Social History Narrative      Review of Systems  Constitutional: Positive for fatigue and unexpected weight change. Negative for fever and chills.  Respiratory: Positive for cough and shortness of breath.   Cardiovascular: Negative for chest pain.  Gastrointestinal: Negative for nausea, vomiting, abdominal pain and blood in stool.  Genitourinary: Negative for dysuria and hematuria.  Musculoskeletal: Positive for back pain.  Neurological: Negative for headaches.    Vital Signs: Pulse 90  Temp(Src) 98.4 F (36.9 C) (Oral)  Resp 19  Ht 5' 6.5" (1.689 m)  SpO2 98%  Physical Exam  Constitutional: He is oriented to person, place, and time. He appears well-developed and well-nourished.  Cardiovascular: Normal rate and regular rhythm.   Pulmonary/Chest: Effort normal and breath sounds normal.  Abdominal: Soft. Bowel sounds are normal. There is no tenderness.  Musculoskeletal: Normal range of  motion. He exhibits no edema.  Neurological: He is alert and oriented to person, place, and time.    Mallampati Score:     Imaging: Mr Jeri Cos [facility] Contrast  03/24/2015   CLINICAL DATA:  Lung mass. Melanoma. Metastatic disease on PET scan.  EXAM: MRI HEAD WITHOUT AND WITH CONTRAST  TECHNIQUE: Multiplanar, multiecho pulse sequences of the brain and surrounding structures were obtained without and with intravenous contrast.  CONTRAST:  85m MULTIHANCE GADOBENATE DIMEGLUMINE 529 MG/ML IV SOLN  COMPARISON:  None.  FINDINGS: Mild atrophy.  Negative for hydrocephalus.  Negative for acute infarct. Mild chronic microvascular ischemic change in the white matter. Negative for intracranial hemorrhage  7 mm enhancing mass in the left superior cerebellum just below the tentorium compatible with metastatic disease.  6 mm enhancing nodule in the left medial parietal lobe. No significant surrounding edema.  Possible third 5 mm lesion in the left occipital parietal lobe medially.  IMPRESSION: 7 mm enhancing mass left superior cerebellum. 6 mm enhancing nodule left medial parietal lobe. No significant edema or shift. Possible third lesion left medial parietal lobe.  These results were called by telephone at the time of interpretation on 03/24/2015 at 7:54 pm to Dr. GLindi Adie who verbally acknowledged these results.   Electronically Signed   By: CFranchot GalloM.D.   On: 03/24/2015 19:56   Nm Pet Image Initial (pi) Skull Base To Thigh  03/21/2015   CLINICAL DATA:  Initial treatment strategy for right lung mass. History of melanoma.  EXAM: NUCLEAR MEDICINE PET WHOLE BODY  TECHNIQUE: 8.7 mCi F-18 FDG was injected intravenously. Full-ring PET imaging was performed from the vertex to the feet after the radiotracer. CT data was obtained and used for attenuation correction and anatomic localization.  FASTING BLOOD GLUCOSE:  Value: 88 mg/dl  COMPARISON:  Limited correlation is made with the reformatted images from an outside chest CT  performed 02/24/2015. The axial images from that study and the report are not available.  FINDINGS: NECK  No hypermetabolic cervical lymph nodes are identified.There are no lesions of the pharyngeal mucosal space. No obvious intracranial lesions are identified. However, there are small hypermetabolic nodules within the scalp which are suspicious for metastatic disease.  CHEST  There is extensive hypermetabolic tumor throughout the right hemithorax. There is a large right retro hilar mass with subcarinal extension, measuring up to 6.5 x 4.0 cm on image 98. This may invade the esophagus. This lesion is hypermetabolic with an SUV max of 15.5. In addition, there are several smaller hypermetabolic right paratracheal and right hilar lymph nodes appear There is nodular hypermetabolic activity associated with a mildly complex partially loculated right pleural effusion. There are several hypermetabolic right-sided pulmonary nodules, including a subpleural right upper lobe lesion measuring 1.7 cm on image 24 and a 1.5 cm lesion in the right lower lobe on image 36. There are no suspicious left-sided pulmonary nodules.  ABDOMEN/PELVIS  There are multiple hypermetabolic liver lesions consistent with metastatic disease. The lesions  are not well seen on the noncontrast images, in part secondary to extensive hepatic steatosis. A lesion in the medial segment of the left lobe has an SUV max of 12.1. There is a hypermetabolic right adrenal nodule measuring 2.6 x 1.8 cm on image 137 (SUV max 7.6). The left adrenal gland appears normal. There is no abnormal activity within the spleen or pancreas. There are small hypermetabolic lymph nodes within the porta hepatis, upper retroperitoneum and retrocrural space.  SKELETON  There is multifocal hypermetabolic osseous metastatic disease. There is prominent involvement within the sternal manubrium (SUV max 10.6) and within the T10 vertebral body (SUV max 8.6). No definite pathologic fractures  identified.  Extremities: There is a hypermetabolic mass within the medial left calf. This involves the gastrocnemius musculature and has an SUV max of 17.9. Small hypermetabolic soft tissue nodules are present within the proximal thigh musculature as well as in the erector spinae musculature. No definite abnormal activity seen within the upper extremities.  IMPRESSION: 1. Extensive metastatic disease with dominant right retro hilar mass which may reflect primary bronchogenic carcinoma. Metastases are present within multiple lymph nodes, the liver, the right adrenal gland, the bones and soft tissues. This distribution can be seen with metastatic melanoma. 2. Metastatic disease is also present within the right lung and pleural space. 3. Prominent hypermetabolic soft tissue mass within the left calf.   Electronically Signed   By: Richardean Sale M.D.   On: 03/21/2015 15:51    Labs:  CBC:  Recent Labs  03/14/15 1354 03/29/15 1115  WBC 9.5 13.6*  HGB 14.6 15.5  HCT 43.3 45.6  PLT 248 255    COAGS:  Recent Labs  03/29/15 1115  INR 1.16  APTT 33    BMP:  Recent Labs  03/14/15 1355 03/29/15 1115  NA 144 138  K 3.2* 3.3*  CL  --  105  CO2 28 23  GLUCOSE 111 113*  BUN 12.9 15  CALCIUM 9.1 9.1  CREATININE 1.4* 1.54*  GFRNONAA  --  42*  GFRAA  --  49*    LIVER FUNCTION TESTS:  Recent Labs  03/14/15 1355  BILITOT 0.38  AST 18  ALT 20  ALKPHOS 74  PROT 6.4  ALBUMIN 3.1*    TUMOR MARKERS: No results for input(s): AFPTM, CEA, CA199, CHROMGRNA in the last 8760 hours.  Assessment and Plan: Truett Mcfarlan is a 74 y.o. male , prior long term smoker, with PMH significant for melanoma RLE 7 years ago, COPD, peripheral arterial disease, renal insuff, osteoarthritis, carotid artery stenosis, HTN, and DM. The patient was seen a few weeks ago by his primary care physician for routine evaluation and was complaining of shortness of breath secondary to his COPD. Subsequent imaging  revealed extensive metastatic disease with a dominant right retrohilar mass as well as hypermetabolic lymph nodes, liver lesions, right adrenal gland, bones, soft tissues. MRI brain revealed left superior cerebellar /left medial parietal lobe mass/nodule. He presents today for US guided liver lesion biopsy.Risks and benefits discussed with the patient/son including, but not limited to bleeding, infection, damage to adjacent structures or low yield requiring additional tests and death. All of the patient's questions were answered, patient is agreeable to proceed.Consent signed and in chart. Pt's last dose of plavix was 6 days ago.       Signed: D. Rowe Robert 03/29/2015, 12:12 PM   I spent a total of 30 minutes in face to face in clinical consultation, greater than 50% of which  was counseling/coordinating care for US guided liver lesion biopsy

## 2015-04-04 ENCOUNTER — Ambulatory Visit (HOSPITAL_COMMUNITY): Payer: Medicare Other

## 2015-04-06 ENCOUNTER — Telehealth: Payer: Self-pay | Admitting: Internal Medicine

## 2015-04-06 ENCOUNTER — Other Ambulatory Visit (HOSPITAL_BASED_OUTPATIENT_CLINIC_OR_DEPARTMENT_OTHER): Payer: Medicare Other

## 2015-04-06 ENCOUNTER — Encounter: Payer: Self-pay | Admitting: Internal Medicine

## 2015-04-06 ENCOUNTER — Ambulatory Visit (HOSPITAL_BASED_OUTPATIENT_CLINIC_OR_DEPARTMENT_OTHER): Payer: Medicare Other | Admitting: Internal Medicine

## 2015-04-06 ENCOUNTER — Encounter: Payer: Self-pay | Admitting: *Deleted

## 2015-04-06 VITALS — BP 109/91 | HR 124 | Temp 97.6°F | Resp 18 | Ht 66.5 in | Wt 174.8 lb

## 2015-04-06 DIAGNOSIS — C787 Secondary malignant neoplasm of liver and intrahepatic bile duct: Secondary | ICD-10-CM

## 2015-04-06 DIAGNOSIS — C7951 Secondary malignant neoplasm of bone: Secondary | ICD-10-CM

## 2015-04-06 DIAGNOSIS — C3490 Malignant neoplasm of unspecified part of unspecified bronchus or lung: Secondary | ICD-10-CM | POA: Insufficient documentation

## 2015-04-06 DIAGNOSIS — Z5111 Encounter for antineoplastic chemotherapy: Secondary | ICD-10-CM

## 2015-04-06 DIAGNOSIS — C3491 Malignant neoplasm of unspecified part of right bronchus or lung: Secondary | ICD-10-CM | POA: Diagnosis not present

## 2015-04-06 DIAGNOSIS — C7989 Secondary malignant neoplasm of other specified sites: Secondary | ICD-10-CM

## 2015-04-06 DIAGNOSIS — C7972 Secondary malignant neoplasm of left adrenal gland: Secondary | ICD-10-CM

## 2015-04-06 DIAGNOSIS — C7801 Secondary malignant neoplasm of right lung: Secondary | ICD-10-CM

## 2015-04-06 DIAGNOSIS — R918 Other nonspecific abnormal finding of lung field: Secondary | ICD-10-CM

## 2015-04-06 DIAGNOSIS — C778 Secondary and unspecified malignant neoplasm of lymph nodes of multiple regions: Secondary | ICD-10-CM

## 2015-04-06 LAB — COMPREHENSIVE METABOLIC PANEL (CC13)
ALT: 18 U/L (ref 0–55)
AST: 19 U/L (ref 5–34)
Albumin: 4 g/dL (ref 3.5–5.0)
Alkaline Phosphatase: 98 U/L (ref 40–150)
Anion Gap: 17 meq/L — ABNORMAL HIGH (ref 3–11)
BUN: 32.2 mg/dL — ABNORMAL HIGH (ref 7.0–26.0)
CO2: 24 meq/L (ref 22–29)
Calcium: 10.6 mg/dL — ABNORMAL HIGH (ref 8.4–10.4)
Chloride: 97 meq/L — ABNORMAL LOW (ref 98–109)
Creatinine: 2.1 mg/dL — ABNORMAL HIGH (ref 0.7–1.3)
EGFR: 30 ml/min/1.73 m2 — ABNORMAL LOW
Glucose: 113 mg/dL (ref 70–140)
Potassium: 3.4 meq/L — ABNORMAL LOW (ref 3.5–5.1)
Sodium: 138 meq/L (ref 136–145)
Total Bilirubin: 1.11 mg/dL (ref 0.20–1.20)
Total Protein: 8.4 g/dL — ABNORMAL HIGH (ref 6.4–8.3)

## 2015-04-06 LAB — CBC WITH DIFFERENTIAL/PLATELET
BASO%: 0.2 % (ref 0.0–2.0)
BASOS ABS: 0 10*3/uL (ref 0.0–0.1)
EOS ABS: 0.9 10*3/uL — AB (ref 0.0–0.5)
EOS%: 5.6 % (ref 0.0–7.0)
HCT: 50.8 % — ABNORMAL HIGH (ref 38.4–49.9)
HEMOGLOBIN: 17.8 g/dL — AB (ref 13.0–17.1)
LYMPH%: 9.9 % — AB (ref 14.0–49.0)
MCH: 31.7 pg (ref 27.2–33.4)
MCHC: 35 g/dL (ref 32.0–36.0)
MCV: 90.6 fL (ref 79.3–98.0)
MONO#: 1.2 10*3/uL — ABNORMAL HIGH (ref 0.1–0.9)
MONO%: 7.6 % (ref 0.0–14.0)
NEUT#: 12.4 10*3/uL — ABNORMAL HIGH (ref 1.5–6.5)
NEUT%: 76.7 % — ABNORMAL HIGH (ref 39.0–75.0)
PLATELETS: 308 10*3/uL (ref 140–400)
RBC: 5.61 10*6/uL (ref 4.20–5.82)
RDW: 12.6 % (ref 11.0–14.6)
WBC: 16.2 10*3/uL — AB (ref 4.0–10.3)
lymph#: 1.6 10*3/uL (ref 0.9–3.3)

## 2015-04-06 MED ORDER — PROCHLORPERAZINE MALEATE 10 MG PO TABS: 10.0000 mg | ORAL_TABLET | Freq: Four times a day (QID) | ORAL | Status: AC | PRN

## 2015-04-06 MED ORDER — CYANOCOBALAMIN 1000 MCG/ML IJ SOLN
1000.0000 ug | Freq: Once | INTRAMUSCULAR | Status: AC
  Administered 2015-04-06: 1000 ug via INTRAMUSCULAR

## 2015-04-06 MED ORDER — DEXAMETHASONE 4 MG PO TABS: ORAL_TABLET | ORAL | Status: DC

## 2015-04-06 MED ORDER — FOLIC ACID 1 MG PO TABS: 1.0000 mg | ORAL_TABLET | Freq: Every day | ORAL | Status: DC

## 2015-04-06 NOTE — Progress Notes (Signed)
Oncology Nurse Navigator Documentation  Oncology Nurse Navigator Flowsheets 04/06/2015  Navigator Encounter Type Initial MedOnc  Patient Visit Type Initial/spoke with patient and family today.  Patient is a new dx of lung cancer.  Patient and son's are updated on patient's disease and treatment.    Dr. Julien Nordmann asked me to call pathology to have tissue sent to [facility] for EGFR/ALK.  I spoke with pathology team and tissue will be sent.    Treatment Phase Abnormal Scans  Barriers/Navigation Needs Education  Education Understanding Cancer/ Treatment Options  Interventions Education Method  Time Spent with Patient 15

## 2015-04-06 NOTE — Telephone Encounter (Signed)
Gave patient avs report and appointments for July and August. Spoke with Todd Poole in [place] will call patient with appointment - patient aware.

## 2015-04-06 NOTE — Telephone Encounter (Signed)
Pt's son called back s/w Audie Clear advise pt can be here in 45 minutes, Melissa s/w MK[facility] advising pt will be here for his apt, this apt was changed due to PA/AJ was out sick.... KJ

## 2015-04-06 NOTE — Progress Notes (Signed)
Todd Poole [phone]   [phone]  OFFICE PROGRESS NOTE  Todd Poole, [place] [address]  DIAGNOSIS: Stage IV (T3, N2, M1 B) non-small cell lung cancer, adenocarcinoma diagnosed in July 2016 presented with multiple right lung lesion in addition to mediastinal lymphadenopathy and metastatic disease to the liver and bones.  PRIOR THERAPY: None  CURRENT THERAPY: Systemic chemotherapy with carboplatin for AUC of 5 and Alimta 500 MG/M2 every 3 weeks. First dose 04/13/2015.  INTERVAL HISTORY: Todd Poole 74 y.o. male returns to the clinic today for follow-up visit accompanied by his 2 sons. The patient continues to complain of increasing fatigue and weakness as well as shortness of breath. He recently underwent several studies for evaluation of his disease including a PET scan as well as MRI of the brain and ultrasound guided core biopsy of a liver lesion. The patient is here today for evaluation and discussion of his treatment options. He denied having any headache or visual changes. He denied having any nausea or vomiting. He continues to have shortness of breath but no significant chest pain or hemoptysis. His PET scan showed extensive metastatic disease with the dominant right true 2 hilar mass concerning for primary bronchogenic carcinoma with metastatic disease involving multiple lymph nodes, the liver, right adrenal gland, bones and soft tissue. There was also metastatic disease present within the right lung and pleural space and prominent hypermetabolic soft tissue mass within the left calf. The patient underwent ultrasound-guided core biopsy of the liver lesion and the final pathology (Accession: [id number]) of the dominant lesion in the liver adjacent to the gallbladder showed metastatic adenocarcinoma. Core biopsies reveal small nests and trabeculae of malignant cells. There is focal gland  formation.Immunohistochemistry reveals the cells are positive for cytokeratin 7 and TTF-1. They are negative for cytokeratin 20,NapsinA, and S100. The overall findings are consistent with metastatic adenocarcinoma with the phenotype favoring a lung primary.  MEDICAL HISTORY: Past Medical History  Diagnosis Date  . COPD (chronic obstructive pulmonary disease)   . Melanoma   . Hypertension   . Diabetes mellitus without complication   . Carotid artery occlusion   . Arthritis     Lower Back    ALLERGIES:  has No Known Allergies.  MEDICATIONS:  Current Outpatient Prescriptions  Medication Sig Dispense Refill  . aspirin 81 MG tablet Take 81 mg by mouth daily.    . clopidogrel (PLAVIX) 75 MG tablet TAKE ONE TABLET BY MOUTH ONCE DAILY 30 tablet 9  . hydrochlorothiazide (HYDRODIURIL) 25 MG tablet TAKE ONE TABLET BY MOUTH ONCE DAILY 30 tablet 3  . simvastatin (ZOCOR) 5 MG tablet TAKE ONE TABLET BY MOUTH AT BEDTIME 90 tablet 0  . SYMBICORT 160-4.5 MCG/ACT inhaler Inhale 1 puff into the lungs daily.    . VENTOLIN HFA 108 (90 BASE) MCG/ACT inhaler Inhale 2 puffs into the lungs every 6 (six) hours as needed for wheezing or shortness of breath.      No current facility-administered medications for this visit.    SURGICAL HISTORY:  Past Surgical History  Procedure Laterality Date  . Tonsillectomy      at age 6 yrs old  . Carotid stent Right December 03, 2013  . Carotid angiogram Bilateral 11/09/2013    Procedure: CAROTID ANGIOGRAM;  Surgeon: Serafina Mitchell, MD;  Location: [facility] CATH LAB;  Service: Cardiovascular;  Laterality: Bilateral;  . Carotid stent insertion Right 12/03/2013    Procedure: CAROTID STENT  INSERTION;  Surgeon: Elam Dutch, MD;  Location: [facility] CATH LAB;  Service: Cardiovascular;  Laterality: Right;    REVIEW OF SYSTEMS:  Constitutional: positive for anorexia, fatigue and weight loss Eyes: negative Ears, nose, mouth, throat, and face: negative Respiratory: positive for cough  and dyspnea on exertion Cardiovascular: negative Gastrointestinal: negative Genitourinary:negative Integument/breast: negative Hematologic/lymphatic: negative Musculoskeletal:negative Neurological: negative Behavioral/Psych: negative Endocrine: negative Allergic/Immunologic: negative   PHYSICAL EXAMINATION: General appearance: alert, cooperative, fatigued and no distress Head: Normocephalic, without obvious abnormality, atraumatic Neck: no adenopathy, no JVD, supple, symmetrical, trachea midline and thyroid not enlarged, symmetric, no tenderness/mass/nodules Lymph nodes: Cervical, supraclavicular, and axillary nodes normal. Resp: clear to auscultation bilaterally Back: symmetric, no curvature. ROM normal. No CVA tenderness. Cardio: regular rate and rhythm, S1, S2 normal, no murmur, click, rub or gallop GI: soft, non-tender; bowel sounds normal; no masses,  no organomegaly Extremities: extremities normal, atraumatic, no cyanosis or edema Neurologic: Alert and oriented X 3, normal strength and tone. Normal symmetric reflexes. Normal coordination and gait  ECOG PERFORMANCE STATUS: 1 - Symptomatic but completely ambulatory  Blood pressure 109/91, pulse 124, temperature 97.6 F (36.4 C), temperature source Oral, resp. rate 18, height 5' 6.5" (1.689 m), weight 174 lb 12.8 oz (79.289 kg), SpO2 99 %.  LABORATORY DATA: Lab Results  Component Value Date   WBC 16.2* 04/06/2015   HGB 17.8* 04/06/2015   HCT 50.8* 04/06/2015   MCV 90.6 04/06/2015   PLT 308 04/06/2015      Chemistry      Component Value Date/Time   NA 138 03/29/2015 1115   NA 144 03/14/2015 1355   K 3.3* 03/29/2015 1115   K 3.2* 03/14/2015 1355   CL 105 03/29/2015 1115   CO2 23 03/29/2015 1115   CO2 28 03/14/2015 1355   BUN 15 03/29/2015 1115   BUN 12.9 03/14/2015 1355   CREATININE 1.54* 03/29/2015 1115   CREATININE 1.4* 03/14/2015 1355   CREATININE 1.73* 12/27/2013 1132      Component Value Date/Time    CALCIUM 9.1 03/29/2015 1115   CALCIUM 9.1 03/14/2015 1355   ALKPHOS 74 03/14/2015 1355   ALKPHOS 46 12/27/2013 1337   AST 18 03/14/2015 1355   AST 23 12/27/2013 1337   ALT 20 03/14/2015 1355   ALT 26 12/27/2013 1337   BILITOT 0.38 03/14/2015 1355   BILITOT 0.8 12/27/2013 1337       RADIOGRAPHIC STUDIES: Mr Jeri Cos Wo Contrast  April 12, 2015   CLINICAL DATA:  Lung mass. Melanoma. Metastatic disease on PET scan.  EXAM: MRI HEAD WITHOUT AND WITH CONTRAST  TECHNIQUE: Multiplanar, multiecho pulse sequences of the brain and surrounding structures were obtained without and with intravenous contrast.  CONTRAST:  22m MULTIHANCE GADOBENATE DIMEGLUMINE 529 MG/ML IV SOLN  COMPARISON:  None.  FINDINGS: Mild atrophy.  Negative for hydrocephalus.  Negative for acute infarct. Mild chronic microvascular ischemic change in the white matter. Negative for intracranial hemorrhage  7 mm enhancing mass in the left superior cerebellum just below the tentorium compatible with metastatic disease.  6 mm enhancing nodule in the left medial parietal lobe. No significant surrounding edema.  Possible third 5 mm lesion in the left occipital parietal lobe medially.  IMPRESSION: 7 mm enhancing mass left superior cerebellum. 6 mm enhancing nodule left medial parietal lobe. No significant edema or shift. Possible third lesion left medial parietal lobe.  These results were called by telephone at the time of interpretation on 707/27/16at 7:54 pm to Dr. GLindi Adie who verbally acknowledged  these results.   Electronically Signed   By: Franchot Gallo M.D.   On: 03/24/2015 19:56   Nm Pet Image Initial (pi) Skull Base To Thigh  03/21/2015   CLINICAL DATA:  Initial treatment strategy for right lung mass. History of melanoma.  EXAM: NUCLEAR MEDICINE PET WHOLE BODY  TECHNIQUE: 8.7 mCi F-18 FDG was injected intravenously. Full-ring PET imaging was performed from the vertex to the feet after the radiotracer. CT data was obtained and used for  attenuation correction and anatomic localization.  FASTING BLOOD GLUCOSE:  Value: 88 mg/dl  COMPARISON:  Limited correlation is made with the reformatted images from an outside chest CT performed 02/24/2015. The axial images from that study and the report are not available.  FINDINGS: NECK  No hypermetabolic cervical lymph nodes are identified.There are no lesions of the pharyngeal mucosal space. No obvious intracranial lesions are identified. However, there are small hypermetabolic nodules within the scalp which are suspicious for metastatic disease.  CHEST  There is extensive hypermetabolic tumor throughout the right hemithorax. There is a large right retro hilar mass with subcarinal extension, measuring up to 6.5 x 4.0 cm on image 98. This may invade the esophagus. This lesion is hypermetabolic with an SUV max of 15.5. In addition, there are several smaller hypermetabolic right paratracheal and right hilar lymph nodes appear There is nodular hypermetabolic activity associated with a mildly complex partially loculated right pleural effusion. There are several hypermetabolic right-sided pulmonary nodules, including a subpleural right upper lobe lesion measuring 1.7 cm on image 24 and a 1.5 cm lesion in the right lower lobe on image 36. There are no suspicious left-sided pulmonary nodules.  ABDOMEN/PELVIS  There are multiple hypermetabolic liver lesions consistent with metastatic disease. The lesions are not well seen on the noncontrast images, in part secondary to extensive hepatic steatosis. A lesion in the medial segment of the left lobe has an SUV max of 12.1. There is a hypermetabolic right adrenal nodule measuring 2.6 x 1.8 cm on image 137 (SUV max 7.6). The left adrenal gland appears normal. There is no abnormal activity within the spleen or pancreas. There are small hypermetabolic lymph nodes within the porta hepatis, upper retroperitoneum and retrocrural space.  SKELETON  There is multifocal hypermetabolic  osseous metastatic disease. There is prominent involvement within the sternal manubrium (SUV max 10.6) and within the T10 vertebral body (SUV max 8.6). No definite pathologic fractures identified.  Extremities: There is a hypermetabolic mass within the medial left calf. This involves the gastrocnemius musculature and has an SUV max of 17.9. Small hypermetabolic soft tissue nodules are present within the proximal thigh musculature as well as in the erector spinae musculature. No definite abnormal activity seen within the upper extremities.  IMPRESSION: 1. Extensive metastatic disease with dominant right retro hilar mass which may reflect primary bronchogenic carcinoma. Metastases are present within multiple lymph nodes, the liver, the right adrenal gland, the bones and soft tissues. This distribution can be seen with metastatic melanoma. 2. Metastatic disease is also present within the right lung and pleural space. 3. Prominent hypermetabolic soft tissue mass within the left calf.   Electronically Signed   By: Richardean Sale M.D.   On: 03/21/2015 15:51   US Biopsy  03/29/2015   INDICATION: History of melanoma and long history of smoking, now with multiple hypermetabolic liver lesions worrisome for metastatic disease. Please perform ultrasound-guided liver lesion biopsy for tissue diagnostic purposes  EXAM: ULTRASOUND GUIDED LIVER LESION BIOPSY  COMPARISON:  PET-CT -  03/21/2015  MEDICATIONS: Fentanyl 25 mcg IV; Versed 1 mg IV  ANESTHESIA/SEDATION: Total Moderate Sedation time  14 minutes  COMPLICATIONS: None immediate  PROCEDURE: Informed written consent was obtained from the patient after a discussion of the risks, benefits and alternatives to treatment. The patient understands and consents the procedure. A timeout was performed prior to the initiation of the procedure.  Ultrasound scanning was performed of the right upper abdominal quadrant demonstrates multiple well-defined hypoechoic masses correlating with  the hypermetabolic lesion seen on preceding PET-CT. The dominant approximately 1.4 x 1.4 cm hypoechoic well-defined lesion within the subcapsular aspect of the medial segment of the left lobe of the liver adjacent to the gallbladder fossa (image 18) was targeted for biopsy due to lesion location and sonographic window. The procedure was planned.  The right upper abdominal quadrant was prepped and draped in the usual sterile fashion. The overlying soft tissues were anesthetized with 1% lidocaine with epinephrine. A 17 gauge, 6.8 cm co-axial needle was advanced into a peripheral aspect of the lesion. This was followed by 4 core biopsies with an 18 gauge core device under direct ultrasound guidance.  The coaxial needle track was embolized with a small amount of Gel-Foam slurry. Superficial hemostasis was achieved with manual compression. Post procedural scanning was negative for definitive area of hemorrhage or additional complication. A dressing was placed. The patient tolerated the procedure well without immediate post procedural complication.  IMPRESSION: Technically successful ultrasound guided core needle biopsy of dominant well-defined approximately 1.4 cm hypoechoic lesion within the subcapsular aspect of the medial segment of left lobe of the liver.   Electronically Signed   By: Sandi Mariscal M.D.   On: 03/29/2015 17:02    ASSESSMENT AND PLAN: This is a very pleasant 74 years old white male recently diagnosed with stage IV non-small cell lung cancer, adenocarcinoma presented with multiple right lung nodules in addition to mediastinal lymphadenopathy as well as metastatic disease to the liver, adrenal, bones as well as subcutaneous tissue. I requested his tissue to be sent for molecular biomarker testing. I had a lengthy discussion with the patient and his sons about his current disease stage, prognosis and treatment options. I gave the patient the option of palliative care and hospice referral versus  consideration of systemic chemotherapy with carboplatin for AUC of 5 and Alimta 500 MG/M2 every 3 weeks. I discussed with the patient adverse effect of this treatment including but not limited to alopecia, myelosuppression, nausea and vomiting, peripheral neuropathy, liver or renal dysfunction. The patient is interested in proceeding with systemic chemotherapy. I will arrange for him to receive vitamin B 12 injection today. I will also call his pharmacy with prescription for Compazine 10 mg by mouth every 6 hours as needed for nausea, folic acid 1 mg by mouth daily in addition to Decadron 4 mg by mouth twice a day the day before, day of and day after the chemotherapy every 3 weeks. I will arrange for the patient to have a chemotherapy education class before receiving the first dose of his treatment. He would come back for follow-up visit in 2 weeks for reevaluation and management of any adverse effect of his treatment. The patient was advised to call immediately if he has any concerning symptoms in the interval. The patient voices understanding of current disease status and treatment options and is in agreement with the current care plan.  All questions were answered. The patient knows to call the clinic with any problems, questions or concerns. We  can certainly see the patient much sooner if necessary.  I spent 20 minutes counseling the patient face to face. The total time spent in the appointment was 30 minutes.  Disclaimer: This note was dictated with voice recognition software. Similar sounding words can inadvertently be transcribed and may not be corrected upon review.

## 2015-04-09 DIAGNOSIS — Z5111 Encounter for antineoplastic chemotherapy: Secondary | ICD-10-CM | POA: Insufficient documentation

## 2015-04-10 ENCOUNTER — Other Ambulatory Visit: Payer: Self-pay | Admitting: Radiation Therapy

## 2015-04-10 DIAGNOSIS — C7931 Secondary malignant neoplasm of brain: Secondary | ICD-10-CM

## 2015-04-11 ENCOUNTER — Encounter: Payer: Self-pay | Admitting: Radiation Therapy

## 2015-04-11 ENCOUNTER — Encounter: Payer: Self-pay | Admitting: *Deleted

## 2015-04-11 ENCOUNTER — Other Ambulatory Visit: Payer: Self-pay | Admitting: Medical Oncology

## 2015-04-11 ENCOUNTER — Other Ambulatory Visit: Payer: Self-pay | Admitting: Internal Medicine

## 2015-04-11 ENCOUNTER — Encounter: Payer: Self-pay | Admitting: Radiation Oncology

## 2015-04-11 ENCOUNTER — Other Ambulatory Visit: Payer: Medicare Other

## 2015-04-11 DIAGNOSIS — I739 Peripheral vascular disease, unspecified: Secondary | ICD-10-CM

## 2015-04-11 DIAGNOSIS — C3490 Malignant neoplasm of unspecified part of unspecified bronchus or lung: Secondary | ICD-10-CM

## 2015-04-11 DIAGNOSIS — I878 Other specified disorders of veins: Secondary | ICD-10-CM

## 2015-04-11 NOTE — Progress Notes (Addendum)
1.  Do you need a wheel chair?    no  2. On oxygen? no  3. Have you ever had any surgery in the body part being scanned? no  4. Have you ever had any surgery on your brain or heart?     no                                                                                  If so, what type, when and Where?  N/A  5. Have you ever had surgery on your eyes or ears?   no       If so, what type, when and where?  N/A  6. Do you have a pacemaker or defibrillator?   no  7. Do you have a Neurostimulator?     no                              Lead wires in place?  N/A  8. Claustrophobic? no  9. Any risk for metal in eyes?  no  10. Injury by bullet, buckshot, or shrapnel? no  11. Stent?     yes   But does not know what kind or exactly when it was placed. He said that he has had other MRI scans without difficulty.                                                            If yes, when was it placed, where, and what kind?  12. Hx of Cancer? Yes                                                                Lung Cancer with mets to the brain and liver        13. Kidney or Liver disease?  yes  14. Hx of Lupus, Rheumatoid Arthritis or Scleroderma? no  15. IV Antibiotics or long term use of NSAIDS? no  16. HX of Hypertension?  no  17. Diabetes?  no  18. Allergy to contrast?   no  19. Recent labs.  Drawn 04/06/15.  BUN 32.2 and Creat 2.1    Mont Dutton R.T. (R) (T) [facility] [place] [phone] Office [phone] Pager [phone] Fax [email or web address]

## 2015-04-11 NOTE — Progress Notes (Addendum)
Location/Histology of Brain Tumor: 7 mm enhancing mass in the left superior cerebellum just below thetentorium compatible with metastatic disease. 6 mm enhancing nodule in the left medial parietal lobe. Nosignificant surrounding edema. Possible third 5 mm lesion in the left occipital parietal lobe medially .(Stage IV  Non small cell carcinoma,adenocarcinoma as well as metastatic disease to right lung and pleural space hypermetabolic soft tissue mass  Within left calf,  the liver,adrenal,bones as well as subcutaneous tissue)  Patient presented with symptoms of:  Increasing weakness,fatigue,sob  Past or anticipated interventions, if any, per neurosurgery: Dr. Sherwood Gambler on 04/14/15 4pm for [facility]  Past or anticipated interventions, if any, per medical oncology: Dr. Julien Nordmann (04/05/15 note ) Gave option of palliative care and hospice verses systemic chemotherapy , chemotherapy class scheduled 04/11/15,tissue to be sent for molecular biomarker testing, 1st chemotherapy  Due 04/13/15 with Carbo/Alimta , referral for [facility] brain,   Dose of Decadron, if applicable:'4mg'$   Oral 2x day the day before,day of and day after chemotherapy ,f/u 2 weeks for reevaluation and mangement of any adverse  effect of treatment, Apr 28, 2015 appt with Ned Card, NP,Med/Onc  Recent neurologic symptoms, if any: no   Seizures: NO  Headaches: NO  Nausea: NO  Dizziness/ataxia: fatigue, weight loss,  Sob, no stamina loss of appetite   Difficulty with hand coordination: weakness  Focal numbness/weakness: weakness,   Visual deficits/changes:No  Confusion/Memory deficits: NO  Painful bone metastases at present, if any: no pain  SAFETY ISSUES:Yes,   Prior radiation? NO  Pacemaker/ICD? NO             IS the patient on methotrexate? NO  Diagnosis 03/29/15: Liver, needle/core biopsy, dominant lesion in liver adjacent to gallbladder  - METASTATIC ADENOCARCINOMA Additional Complaints / other details:,Divorced, 5 children,Hx  Melanoma  RLE 7 years ago,  Carotid stent right 12/03/13, carotid angiogram 11/09/13/COPD,, hX HTN, , DM without complication;smoker 3ppd for  50 years, quit 1/1.2009,never smokeless tobacco, no alcohol or illicit drug use, discussed video to watch on [email or web address] patient has a computer at home, son [name] in with patient today  Allergies:NKA BP 114/53 mmHg  Pulse 94  Temp(Src) 98 F (36.7 C) (Oral)  Resp 20  Ht 5' 6.5" (1.689 m)  Wt 173 lb 3.2 oz (78.563 kg)  BMI 27.54 kg/m2  SpO2 97%  Wt Readings from Last 3 Encounters:  04/12/15 173 lb 3.2 oz (78.563 kg)  04/06/15 174 lb 12.8 oz (79.289 kg)  03/24/15 193 lb (87.544 kg)

## 2015-04-12 ENCOUNTER — Ambulatory Visit
Admission: RE | Admit: 2015-04-12 | Discharge: 2015-04-12 | Disposition: A | Discharge status: Home / Self Care | Payer: Medicare Other | Source: Ambulatory Visit | Attending: Radiation Oncology | Admitting: Radiation Oncology

## 2015-04-12 ENCOUNTER — Encounter: Payer: Self-pay | Admitting: Radiation Oncology

## 2015-04-12 VITALS — BP 114/53 | HR 94 | Temp 98.0°F | Resp 20 | Ht 66.5 in | Wt 173.2 lb

## 2015-04-12 DIAGNOSIS — C7949 Secondary malignant neoplasm of other parts of nervous system: Secondary | ICD-10-CM

## 2015-04-12 DIAGNOSIS — C7931 Secondary malignant neoplasm of brain: Secondary | ICD-10-CM | POA: Insufficient documentation

## 2015-04-12 DIAGNOSIS — I10 Essential (primary) hypertension: Secondary | ICD-10-CM | POA: Insufficient documentation

## 2015-04-12 DIAGNOSIS — E119 Type 2 diabetes mellitus without complications: Secondary | ICD-10-CM | POA: Insufficient documentation

## 2015-04-12 DIAGNOSIS — I6529 Occlusion and stenosis of unspecified carotid artery: Secondary | ICD-10-CM | POA: Diagnosis not present

## 2015-04-12 DIAGNOSIS — J441 Chronic obstructive pulmonary disease with (acute) exacerbation: Secondary | ICD-10-CM | POA: Insufficient documentation

## 2015-04-12 DIAGNOSIS — C7952 Secondary malignant neoplasm of bone marrow: Secondary | ICD-10-CM

## 2015-04-12 DIAGNOSIS — Z510 Encounter for antineoplastic radiation therapy: Secondary | ICD-10-CM | POA: Insufficient documentation

## 2015-04-12 DIAGNOSIS — C7951 Secondary malignant neoplasm of bone: Secondary | ICD-10-CM | POA: Insufficient documentation

## 2015-04-12 DIAGNOSIS — C787 Secondary malignant neoplasm of liver and intrahepatic bile duct: Secondary | ICD-10-CM | POA: Diagnosis not present

## 2015-04-12 DIAGNOSIS — C3491 Malignant neoplasm of unspecified part of right bronchus or lung: Secondary | ICD-10-CM | POA: Insufficient documentation

## 2015-04-12 HISTORY — DX: Malignant neoplasm of bone and articular cartilage, unspecified: C41.9

## 2015-04-12 HISTORY — DX: Malignant neoplasm of brain, unspecified: C71.9

## 2015-04-12 HISTORY — DX: Malignant (primary) neoplasm, unspecified: C80.1

## 2015-04-12 NOTE — Progress Notes (Signed)
Radiation Oncology         [phone] ________________________________  Name: Todd Poole MRN: [id number]  Date: 04/12/2015  DOB: [birth year]  PJ:[id number],QBHALP, FNP  Curt Bears, MD     REFERRING PHYSICIAN: Curt Bears, MD   DIAGNOSIS: The encounter diagnosis was Brain metastases.  Stage IV (T3, N2, M1 B) non-small cell lung cancer, adenocarcinoma diagnosed in July 2016 presented with multiple right lung lesion in addition to mediastinal lymphadenopathy and metastatic disease to the liver and bones.   HISTORY OF PRESENT ILLNESS::Todd Poole is a 74 y.o. male who is seen for an initial consultation visit regarding the patient's diagnosis of non-small cell lung cancer with brain metastases. Patient's history is significant for melanoma and heavy smoking. The patient presented with symptoms of low appetite, increasing fatigue and weakness as well as shortness of breath. He recently underwent several studies for evaluation of his disease including a PET scan as well as MRI of the brain and ultrasound guided core biopsy of a liver lesion. 03/21/15 PET scan revealed extensive metastatic disease with dominant right retro hilar mass which may reflect primary bronchogenic carcinoma. Metastases are present within multiple lymph nodes, the right lung/pleural space, the liver, the right adrenal gland, the bones and soft tissues. 03/24/15 MRI of the brain revealed a 7 mm enhancing mass in the left superior cerebellum, a 6 mm enhancing nodule in the left medial parietal lobe. It was noted that a possible third lesion could be present in in the left medial parietal lobe. No significant edema is present.   Dr. Julien Nordmann (04/05/15 note): Gave option of palliative care and hospice verses systemic chemotherapy , attended chemotherapy class 04/11/15, tissue to be sent for molecular biomarker testing, 1st chemotherapy due 04/13/15 with Carbo/Alimta referral for SRS brain.   Patient notes he lost 60  lbs since he quit smoking 7 years ago.  Patient notes soreness/pain in the right flank and lower back. Takes Tylenol to alleviate back pain.    PREVIOUS RADIATION THERAPY: No   PAST MEDICAL HISTORY:  has a past medical history of COPD (chronic obstructive pulmonary disease); Melanoma; Hypertension; Diabetes mellitus without complication; Carotid artery occlusion; Arthritis; Brain cancer (03/24/15); Cancer (03/29/15 bx); and Bone cancer.     PAST SURGICAL HISTORY: Past Surgical History  Procedure Laterality Date  . Tonsillectomy      at age 76 yrs old  . Carotid stent Right December 03, 2013  . Carotid angiogram Bilateral 11/09/2013    Procedure: CAROTID ANGIOGRAM;  Surgeon: Serafina Mitchell, MD;  Location: [facility] CATH LAB;  Service: Cardiovascular;  Laterality: Bilateral;  . Carotid stent insertion Right 12/03/2013    Procedure: CAROTID STENT INSERTION;  Surgeon: Elam Dutch, MD;  Location: [facility] CATH LAB;  Service: Cardiovascular;  Laterality: Right;     FAMILY HISTORY: family history includes Dementia in his father; Kidney disease in his father.   SOCIAL HISTORY:  reports that he quit smoking about 7 years ago. His smoking use included Cigarettes. He has a 150 pack-year smoking history. He has never used smokeless tobacco. He reports that he does not drink alcohol or use illicit drugs.   ALLERGIES: Review of patient's allergies indicates no known allergies.   MEDICATIONS:  Current Outpatient Prescriptions  Medication Sig Dispense Refill  . aspirin 81 MG tablet Take 81 mg by mouth daily.    . clopidogrel (PLAVIX) 75 MG tablet TAKE ONE TABLET BY MOUTH ONCE DAILY 30 tablet 9  . dexamethasone (DECADRON) 4 MG tablet 4 mg  po bid, the day before, day of and day after chemo 40 tablet 1  . folic acid (FOLVITE) 1 MG tablet Take 1 tablet (1 mg total) by mouth daily. 30 tablet 4  . hydrochlorothiazide (HYDRODIURIL) 25 MG tablet TAKE ONE TABLET BY MOUTH ONCE DAILY 30 tablet 3  . prochlorperazine  (COMPAZINE) 10 MG tablet Take 1 tablet (10 mg total) by mouth every 6 (six) hours as needed for nausea or vomiting. 60 tablet 0  . simvastatin (ZOCOR) 5 MG tablet TAKE ONE TABLET BY MOUTH AT BEDTIME 90 tablet 0  . SYMBICORT 160-4.5 MCG/ACT inhaler Inhale 1 puff into the lungs daily.    . VENTOLIN HFA 108 (90 BASE) MCG/ACT inhaler Inhale 2 puffs into the lungs every 6 (six) hours as needed for wheezing or shortness of breath.      No current facility-administered medications for this encounter.     REVIEW OF SYSTEMS:  A 15 point review of systems is documented in the electronic medical record. This was obtained by the nursing staff. However, I reviewed this with the patient to discuss relevant findings and make appropriate changes.  Pertinent items are noted in HPI.    PHYSICAL EXAM:  height is 5' 6.5" (1.689 m) and weight is 173 lb 3.2 oz (78.563 kg). His oral temperature is 98 F (36.7 C). His blood pressure is 114/53 and his pulse is 94. His respiration is 20 and oxygen saturation is 97%.   General: Well-developed, in no acute distress HEENT: Normocephalic, atraumatic Cardiovascular: Regular rate and rhythm Respiratory: Clear to auscultation bilaterally GI: Soft, nontender, normal bowel sounds Extremities: No edema present Neck: No palpable cervical, supraclavicular or axillary lymphoadenopathy  ECOG = 1-2  0 - Asymptomatic (Fully active, able to carry on all predisease activities without restriction)  1 - Symptomatic but completely ambulatory (Restricted in physically strenuous activity but ambulatory and able to carry out work of a light or sedentary nature. For example, light housework, office work)  2 - Symptomatic, <50% in bed during the day (Ambulatory and capable of all self care but unable to carry out any work activities. Up and about more than 50% of waking hours)  3 - Symptomatic, >50% in bed, but not bedbound (Capable of only limited self-care, confined to bed or chair 50%  or more of waking hours)  4 - Bedbound (Completely disabled. Cannot carry on any self-care. Totally confined to bed or chair)  5 - Death   Eustace Pen MM, Creech RH, Tormey DC, et al. [phone]). "Toxicity and response criteria of the [facility]". [place] Oncol. 5 (6): 649-55  _   LABORATORY DATA:  Lab Results  Component Value Date   WBC 16.2* 04/06/2015   HGB 17.8* 04/06/2015   HCT 50.8* 04/06/2015   MCV 90.6 04/06/2015   PLT 308 04/06/2015   Lab Results  Component Value Date   NA 138 04/06/2015   K 3.4* 04/06/2015   CL 105 03/29/2015   CO2 24 04/06/2015   Lab Results  Component Value Date   ALT 18 04/06/2015   AST 19 04/06/2015   ALKPHOS 98 04/06/2015   BILITOT 1.11 04/06/2015      RADIOGRAPHY: Mr Jeri Cos Wo Contrast  03/24/2015   CLINICAL DATA:  Lung mass. Melanoma. Metastatic disease on PET scan.  EXAM: MRI HEAD WITHOUT AND WITH CONTRAST  TECHNIQUE: Multiplanar, multiecho pulse sequences of the brain and surrounding structures were obtained without and with intravenous contrast.  CONTRAST:  37m  MULTIHANCE GADOBENATE DIMEGLUMINE 529 MG/ML IV SOLN  COMPARISON:  None.  FINDINGS: Mild atrophy.  Negative for hydrocephalus.  Negative for acute infarct. Mild chronic microvascular ischemic change in the white matter. Negative for intracranial hemorrhage  7 mm enhancing mass in the left superior cerebellum just below the tentorium compatible with metastatic disease.  6 mm enhancing nodule in the left medial parietal lobe. No significant surrounding edema.  Possible third 5 mm lesion in the left occipital parietal lobe medially.  IMPRESSION: 7 mm enhancing mass left superior cerebellum. 6 mm enhancing nodule left medial parietal lobe. No significant edema or shift. Possible third lesion left medial parietal lobe.  These results were called by telephone at the time of interpretation on 03/24/2015 at 7:54 pm to Dr. Lindi Adie, who verbally acknowledged these results.    Electronically Signed   By: Franchot Gallo M.D.   On: 03/24/2015 19:56   Nm Pet Image Initial (pi) Skull Base To Thigh  03/21/2015   CLINICAL DATA:  Initial treatment strategy for right lung mass. History of melanoma.  EXAM: NUCLEAR MEDICINE PET WHOLE BODY  TECHNIQUE: 8.7 mCi F-18 FDG was injected intravenously. Full-ring PET imaging was performed from the vertex to the feet after the radiotracer. CT data was obtained and used for attenuation correction and anatomic localization.  FASTING BLOOD GLUCOSE:  Value: 88 mg/dl  COMPARISON:  Limited correlation is made with the reformatted images from an outside chest CT performed 02/24/2015. The axial images from that study and the report are not available.  FINDINGS: NECK  No hypermetabolic cervical lymph nodes are identified.There are no lesions of the pharyngeal mucosal space. No obvious intracranial lesions are identified. However, there are small hypermetabolic nodules within the scalp which are suspicious for metastatic disease.  CHEST  There is extensive hypermetabolic tumor throughout the right hemithorax. There is a large right retro hilar mass with subcarinal extension, measuring up to 6.5 x 4.0 cm on image 98. This may invade the esophagus. This lesion is hypermetabolic with an SUV max of 15.5. In addition, there are several smaller hypermetabolic right paratracheal and right hilar lymph nodes appear There is nodular hypermetabolic activity associated with a mildly complex partially loculated right pleural effusion. There are several hypermetabolic right-sided pulmonary nodules, including a subpleural right upper lobe lesion measuring 1.7 cm on image 24 and a 1.5 cm lesion in the right lower lobe on image 36. There are no suspicious left-sided pulmonary nodules.  ABDOMEN/PELVIS  There are multiple hypermetabolic liver lesions consistent with metastatic disease. The lesions are not well seen on the noncontrast images, in part secondary to extensive hepatic  steatosis. A lesion in the medial segment of the left lobe has an SUV max of 12.1. There is a hypermetabolic right adrenal nodule measuring 2.6 x 1.8 cm on image 137 (SUV max 7.6). The left adrenal gland appears normal. There is no abnormal activity within the spleen or pancreas. There are small hypermetabolic lymph nodes within the porta hepatis, upper retroperitoneum and retrocrural space.  SKELETON  There is multifocal hypermetabolic osseous metastatic disease. There is prominent involvement within the sternal manubrium (SUV max 10.6) and within the T10 vertebral body (SUV max 8.6). No definite pathologic fractures identified.  Extremities: There is a hypermetabolic mass within the medial left calf. This involves the gastrocnemius musculature and has an SUV max of 17.9. Small hypermetabolic soft tissue nodules are present within the proximal thigh musculature as well as in the erector spinae musculature. No definite abnormal activity seen  within the upper extremities.  IMPRESSION: 1. Extensive metastatic disease with dominant right retro hilar mass which may reflect primary bronchogenic carcinoma. Metastases are present within multiple lymph nodes, the liver, the right adrenal gland, the bones and soft tissues. This distribution can be seen with metastatic melanoma. 2. Metastatic disease is also present within the right lung and pleural space. 3. Prominent hypermetabolic soft tissue mass within the left calf.   Electronically Signed   By: Richardean Sale M.D.   On: 03/21/2015 15:51   US Biopsy  03/29/2015   INDICATION: History of melanoma and long history of smoking, now with multiple hypermetabolic liver lesions worrisome for metastatic disease. Please perform ultrasound-guided liver lesion biopsy for tissue diagnostic purposes  EXAM: ULTRASOUND GUIDED LIVER LESION BIOPSY  COMPARISON:  PET-CT - 03/21/2015  MEDICATIONS: Fentanyl 25 mcg IV; Versed 1 mg IV  ANESTHESIA/SEDATION: Total Moderate Sedation time  14  minutes  COMPLICATIONS: None immediate  PROCEDURE: Informed written consent was obtained from the patient after a discussion of the risks, benefits and alternatives to treatment. The patient understands and consents the procedure. A timeout was performed prior to the initiation of the procedure.  Ultrasound scanning was performed of the right upper abdominal quadrant demonstrates multiple well-defined hypoechoic masses correlating with the hypermetabolic lesion seen on preceding PET-CT. The dominant approximately 1.4 x 1.4 cm hypoechoic well-defined lesion within the subcapsular aspect of the medial segment of the left lobe of the liver adjacent to the gallbladder fossa (image 18) was targeted for biopsy due to lesion location and sonographic window. The procedure was planned.  The right upper abdominal quadrant was prepped and draped in the usual sterile fashion. The overlying soft tissues were anesthetized with 1% lidocaine with epinephrine. A 17 gauge, 6.8 cm co-axial needle was advanced into a peripheral aspect of the lesion. This was followed by 4 core biopsies with an 18 gauge core device under direct ultrasound guidance.  The coaxial needle track was embolized with a small amount of Gel-Foam slurry. Superficial hemostasis was achieved with manual compression. Post procedural scanning was negative for definitive area of hemorrhage or additional complication. A dressing was placed. The patient tolerated the procedure well without immediate post procedural complication.  IMPRESSION: Technically successful ultrasound guided core needle biopsy of dominant well-defined approximately 1.4 cm hypoechoic lesion within the subcapsular aspect of the medial segment of left lobe of the liver.   Electronically Signed   By: Sandi Mariscal M.D.   On: 03/29/2015 17:02       IMPRESSION:   No history exists.   Patient  is a 74 y.o. male with Stage IV (T3, N2, M1 B) non-small cell lung cancer and associated brain metastases  (two sub-centimeter metastases seen in recent scan). The patient would be a good candidate for radiation treatment to these lesions.  In review of his PET scan, the patient would also benefit from palliative radiation treatment notably to the spine.     PLAN: We discussed the possible side effects and risks of treatment in addition to the possible benefits of treatment. We discussed the protocol for radiation treatment.  All of the patient's questions were answered. The patient does wish to proceed with this treatment. A simulation will be scheduled such that we can proceed with treatment planning.  A repeat MRI has been scheduled.   I will discuss palliative radiation treatment to the spine at a later date.   MRI scheduled on 04/14/15  at 1 pm.  Dr. Sherwood Gambler on  04/14/15 4pm for SRS Brain      ________________________________   Jodelle Gross, MD, PhD   **Disclaimer: This note was dictated with voice recognition software. Similar sounding words can inadvertently be transcribed and this note may contain transcription errors which may not have been corrected upon publication of note.**  This document serves as a record of services personally performed by Kyung Rudd, MD. It was created on his behalf by Derek Mound, a trained medical scribe. The creation of this record is based on the scribe's personal observations and the provider's statements to them. This document has been checked and approved by the attending provider.

## 2015-04-13 ENCOUNTER — Other Ambulatory Visit (HOSPITAL_BASED_OUTPATIENT_CLINIC_OR_DEPARTMENT_OTHER): Payer: Medicare Other

## 2015-04-13 ENCOUNTER — Telehealth: Payer: Self-pay

## 2015-04-13 ENCOUNTER — Other Ambulatory Visit: Payer: Self-pay | Admitting: Internal Medicine

## 2015-04-13 ENCOUNTER — Other Ambulatory Visit (HOSPITAL_COMMUNITY)
Admission: RE | Admit: 2015-04-13 | Discharge: 2015-04-13 | Disposition: A | Discharge status: Home / Self Care | Payer: Medicare Other | Source: Ambulatory Visit | Attending: Internal Medicine | Admitting: Internal Medicine

## 2015-04-13 ENCOUNTER — Ambulatory Visit (HOSPITAL_BASED_OUTPATIENT_CLINIC_OR_DEPARTMENT_OTHER): Payer: Medicare Other

## 2015-04-13 ENCOUNTER — Other Ambulatory Visit: Payer: Self-pay | Admitting: *Deleted

## 2015-04-13 VITALS — BP 140/55 | HR 76 | Temp 97.9°F | Resp 17

## 2015-04-13 DIAGNOSIS — C3491 Malignant neoplasm of unspecified part of right bronchus or lung: Secondary | ICD-10-CM

## 2015-04-13 DIAGNOSIS — C3490 Malignant neoplasm of unspecified part of unspecified bronchus or lung: Secondary | ICD-10-CM

## 2015-04-13 DIAGNOSIS — C787 Secondary malignant neoplasm of liver and intrahepatic bile duct: Secondary | ICD-10-CM

## 2015-04-13 DIAGNOSIS — C7972 Secondary malignant neoplasm of left adrenal gland: Secondary | ICD-10-CM | POA: Diagnosis not present

## 2015-04-13 DIAGNOSIS — C7989 Secondary malignant neoplasm of other specified sites: Secondary | ICD-10-CM

## 2015-04-13 DIAGNOSIS — Z5111 Encounter for antineoplastic chemotherapy: Secondary | ICD-10-CM

## 2015-04-13 DIAGNOSIS — C7951 Secondary malignant neoplasm of bone: Secondary | ICD-10-CM | POA: Diagnosis not present

## 2015-04-13 LAB — CBC WITH DIFFERENTIAL/PLATELET
BASO%: 0.2 % (ref 0.0–2.0)
BASOS ABS: 0 10*3/uL (ref 0.0–0.1)
EOS%: 0 % (ref 0.0–7.0)
Eosinophils Absolute: 0 10*3/uL (ref 0.0–0.5)
HEMATOCRIT: 47.4 % (ref 38.4–49.9)
HGB: 16 g/dL (ref 13.0–17.1)
LYMPH%: 3.6 % — ABNORMAL LOW (ref 14.0–49.0)
MCH: 30.6 pg (ref 27.2–33.4)
MCHC: 33.8 g/dL (ref 32.0–36.0)
MCV: 90.4 fL (ref 79.3–98.0)
MONO#: 0.3 10*3/uL (ref 0.1–0.9)
MONO%: 1.4 % (ref 0.0–14.0)
NEUT#: 22.1 10*3/uL — ABNORMAL HIGH (ref 1.5–6.5)
NEUT%: 94.8 % — AB (ref 39.0–75.0)
PLATELETS: 320 10*3/uL (ref 140–400)
RBC: 5.25 10*6/uL (ref 4.20–5.82)
RDW: 13.1 % (ref 11.0–14.6)
WBC: 23.3 10*3/uL — AB (ref 4.0–10.3)
lymph#: 0.8 10*3/uL — ABNORMAL LOW (ref 0.9–3.3)

## 2015-04-13 LAB — COMPREHENSIVE METABOLIC PANEL (CC13)
ALK PHOS: 84 U/L (ref 40–150)
ALT: 17 U/L (ref 0–55)
AST: 19 U/L (ref 5–34)
Albumin: 3.7 g/dL (ref 3.5–5.0)
Anion Gap: 13 mEq/L — ABNORMAL HIGH (ref 3–11)
BUN: 42 mg/dL — ABNORMAL HIGH (ref 7.0–26.0)
CALCIUM: 9.6 mg/dL (ref 8.4–10.4)
CO2: 25 mEq/L (ref 22–29)
Chloride: 96 mEq/L — ABNORMAL LOW (ref 98–109)
Creatinine: 2.2 mg/dL — ABNORMAL HIGH (ref 0.7–1.3)
EGFR: 27 mL/min/{1.73_m2} — ABNORMAL LOW (ref >90)
Glucose: 150 mg/dl — ABNORMAL HIGH (ref 70–140)
Potassium: 3.3 mEq/L — ABNORMAL LOW (ref 3.5–5.1)
Sodium: 134 mEq/L — ABNORMAL LOW (ref 136–145)
Total Bilirubin: 0.64 mg/dL (ref 0.20–1.20)
Total Protein: 7.5 g/dL (ref 6.4–8.3)

## 2015-04-13 MED ORDER — SODIUM CHLORIDE 0.9 % IV SOLN
284.0000 mg | Freq: Once | INTRAVENOUS | Status: AC
  Administered 2015-04-13: 280 mg via INTRAVENOUS
  Filled 2015-04-13: qty 28

## 2015-04-13 MED ORDER — FAMOTIDINE IN NACL 20-0.9 MG/50ML-% IV SOLN
20.0000 mg | Freq: Once | INTRAVENOUS | Status: AC
  Administered 2015-04-13: 20 mg via INTRAVENOUS

## 2015-04-13 MED ORDER — DIPHENHYDRAMINE HCL 50 MG/ML IJ SOLN
INTRAMUSCULAR | Status: AC
  Filled 2015-04-13: qty 1

## 2015-04-13 MED ORDER — DIPHENHYDRAMINE HCL 50 MG/ML IJ SOLN
50.0000 mg | Freq: Once | INTRAMUSCULAR | Status: AC
  Administered 2015-04-13: 50 mg via INTRAVENOUS

## 2015-04-13 MED ORDER — FAMOTIDINE IN NACL 20-0.9 MG/50ML-% IV SOLN
INTRAVENOUS | Status: AC
Start: 2015-04-13 — End: 2015-04-13
  Filled 2015-04-13: qty 50

## 2015-04-13 MED ORDER — PACLITAXEL CHEMO INJECTION 300 MG/50ML
175.0000 mg/m2 | Freq: Once | INTRAVENOUS | Status: AC
  Administered 2015-04-13: 336 mg via INTRAVENOUS
  Filled 2015-04-13: qty 56

## 2015-04-13 MED ORDER — SODIUM CHLORIDE 0.9 % IV SOLN
Freq: Once | INTRAVENOUS | Status: AC
  Administered 2015-04-13: 14:00:00 via INTRAVENOUS

## 2015-04-13 MED ORDER — SODIUM CHLORIDE 0.9 % IV SOLN
Freq: Once | INTRAVENOUS | Status: AC
  Administered 2015-04-13: 14:00:00 via INTRAVENOUS
  Filled 2015-04-13: qty 8

## 2015-04-13 NOTE — Telephone Encounter (Signed)
rec'd call from Radiology @ [facility].  Requested approval to hold Plavix prior to Porta-cath placement.  Discussed with Dr. Oneida Alar.  Per Dr. Oneida Alar okay to hold Plavix for time interval recommended by Radiologist, prior to Porta-cath placement, and to resume, as soon as possible, after procedure.  Will fax note to [facility], Radiology Dept.

## 2015-04-13 NOTE — Patient Instructions (Addendum)
[facility] Discharge Instructions for Patients Receiving Chemotherapy  Today you received the following chemotherapy agents: Taxol and Carboplatin.   To help prevent nausea and vomiting after your treatment, we encourage you to take your nausea medication: Compazine. Take one every 6 hours as needed. (This may make you drowsy.)   If you develop nausea and vomiting that is not controlled by your nausea medication, call the clinic.   Discontinue Decadron.   BELOW ARE SYMPTOMS THAT SHOULD BE REPORTED IMMEDIATELY:  *FEVER GREATER THAN 100.5 F  *CHILLS WITH OR WITHOUT FEVER  NAUSEA AND VOMITING THAT IS NOT CONTROLLED WITH YOUR NAUSEA MEDICATION  *UNUSUAL SHORTNESS OF BREATH  *UNUSUAL BRUISING OR BLEEDING  TENDERNESS IN MOUTH AND THROAT WITH OR WITHOUT PRESENCE OF ULCERS  *URINARY PROBLEMS  *BOWEL PROBLEMS  UNUSUAL RASH Items with * indicate a potential emergency and should be followed up as soon as possible.  Feel free to call the clinic should you have any questions or concerns. The clinic phone number is [phone].  Please show the [facility] at check-in to the Emergency Department and triage nurse.

## 2015-04-13 NOTE — Progress Notes (Signed)
No note

## 2015-04-13 NOTE — Progress Notes (Signed)
Late entry for 1300: CMET reviewed with Dr. Julien Nordmann: Creatinine 2.2. Order received to HOLD Alimta. Treatment plan changed to Taxol/Carbo. Ebony in managed care notified: OK to proceed with treatment today. Taxol teaching complete. Pt and son asked appropriate questions.  Dr. Julien Nordmann in to see pt, treatment plan changes reviewed. Neulasta information reviewed.

## 2015-04-14 ENCOUNTER — Ambulatory Visit
Admission: RE | Admit: 2015-04-14 | Discharge: 2015-04-14 | Disposition: A | Discharge status: Home / Self Care | Payer: Medicare Other | Source: Ambulatory Visit | Attending: Radiation Oncology | Admitting: Radiation Oncology

## 2015-04-14 ENCOUNTER — Telehealth: Payer: Self-pay

## 2015-04-14 ENCOUNTER — Telehealth: Payer: Self-pay | Admitting: Medical Oncology

## 2015-04-14 ENCOUNTER — Other Ambulatory Visit: Payer: Self-pay | Admitting: Radiation Therapy

## 2015-04-14 ENCOUNTER — Ambulatory Visit (HOSPITAL_BASED_OUTPATIENT_CLINIC_OR_DEPARTMENT_OTHER): Payer: Medicare Other

## 2015-04-14 ENCOUNTER — Other Ambulatory Visit: Payer: Self-pay | Admitting: Medical Oncology

## 2015-04-14 ENCOUNTER — Encounter: Payer: Self-pay | Admitting: Radiation Oncology

## 2015-04-14 VITALS — BP 120/92 | HR 101 | Resp 18

## 2015-04-14 DIAGNOSIS — C3490 Malignant neoplasm of unspecified part of unspecified bronchus or lung: Secondary | ICD-10-CM | POA: Diagnosis not present

## 2015-04-14 DIAGNOSIS — C7931 Secondary malignant neoplasm of brain: Secondary | ICD-10-CM | POA: Diagnosis not present

## 2015-04-14 DIAGNOSIS — C3491 Malignant neoplasm of unspecified part of right bronchus or lung: Secondary | ICD-10-CM | POA: Diagnosis not present

## 2015-04-14 DIAGNOSIS — E119 Type 2 diabetes mellitus without complications: Secondary | ICD-10-CM | POA: Diagnosis not present

## 2015-04-14 DIAGNOSIS — C7949 Secondary malignant neoplasm of other parts of nervous system: Secondary | ICD-10-CM

## 2015-04-14 DIAGNOSIS — Z510 Encounter for antineoplastic radiation therapy: Secondary | ICD-10-CM | POA: Diagnosis not present

## 2015-04-14 DIAGNOSIS — C787 Secondary malignant neoplasm of liver and intrahepatic bile duct: Secondary | ICD-10-CM | POA: Diagnosis not present

## 2015-04-14 DIAGNOSIS — Z5189 Encounter for other specified aftercare: Secondary | ICD-10-CM

## 2015-04-14 DIAGNOSIS — C7951 Secondary malignant neoplasm of bone: Secondary | ICD-10-CM | POA: Diagnosis not present

## 2015-04-14 DIAGNOSIS — I6529 Occlusion and stenosis of unspecified carotid artery: Secondary | ICD-10-CM | POA: Diagnosis not present

## 2015-04-14 DIAGNOSIS — C7952 Secondary malignant neoplasm of bone marrow: Secondary | ICD-10-CM

## 2015-04-14 DIAGNOSIS — J441 Chronic obstructive pulmonary disease with (acute) exacerbation: Secondary | ICD-10-CM | POA: Diagnosis not present

## 2015-04-14 DIAGNOSIS — I10 Essential (primary) hypertension: Secondary | ICD-10-CM | POA: Diagnosis not present

## 2015-04-14 MED ORDER — PEGFILGRASTIM 6 MG/0.6ML ~~LOC~~ PSKT
6.0000 mg | PREFILLED_SYRINGE | Freq: Once | SUBCUTANEOUS | Status: AC
  Administered 2015-04-14: 6 mg via SUBCUTANEOUS
  Filled 2015-04-14: qty 0.6

## 2015-04-14 NOTE — Telephone Encounter (Signed)
I left son a message that we can do the onpro today before or after radiation today. He was ordered for neulasta but transportation on sat was a problem.

## 2015-04-14 NOTE — Telephone Encounter (Signed)
[name] called about possibly putting onpro on father. This was to keep from having to come in on Saturday(tomorrow). He is driving to MRI at time of call. He will see XRT today. Told him to have XRT call us when he is finished and then this can be discussed. [place] and [place] were both talking and the connection was poor.

## 2015-04-14 NOTE — Patient Instructions (Signed)
[facility] Discharge Instructions for Patients Receiving Chemotherapy Today you received the onpro neulasta injector. Yesterday you you received chemotherapy   To help prevent nausea and vomiting after your treatment, we encourage you to take your nausea medication  If you develop nausea and vomiting that is not controlled by your nausea medication, call the clinic.   BELOW ARE SYMPTOMS THAT SHOULD BE REPORTED IMMEDIATELY:  *FEVER GREATER THAN 100.5 F  *CHILLS WITH OR WITHOUT FEVER  NAUSEA AND VOMITING THAT IS NOT CONTROLLED WITH YOUR NAUSEA MEDICATION  *UNUSUAL SHORTNESS OF BREATH  *UNUSUAL BRUISING OR BLEEDING  TENDERNESS IN MOUTH AND THROAT WITH OR WITHOUT PRESENCE OF ULCERS  *URINARY PROBLEMS  *BOWEL PROBLEMS  UNUSUAL RASH Items with * indicate a potential emergency and should be followed up as soon as possible.  Feel free to call the clinic you have any questions or concerns. The clinic phone number is [phone].  Please show the [facility] at check-in to the Emergency Department and triage nurse.

## 2015-04-15 ENCOUNTER — Ambulatory Visit: Payer: Medicare Other

## 2015-04-17 ENCOUNTER — Ambulatory Visit
Admission: RE | Admit: 2015-04-17 | Discharge: 2015-04-17 | Disposition: A | Discharge status: Home / Self Care | Payer: Medicare Other | Source: Ambulatory Visit | Attending: Radiation Oncology | Admitting: Radiation Oncology

## 2015-04-17 ENCOUNTER — Other Ambulatory Visit: Payer: Self-pay | Admitting: Radiation Therapy

## 2015-04-17 DIAGNOSIS — E119 Type 2 diabetes mellitus without complications: Secondary | ICD-10-CM | POA: Diagnosis not present

## 2015-04-17 DIAGNOSIS — Z6825 Body mass index (BMI) 25.0-25.9, adult: Secondary | ICD-10-CM | POA: Diagnosis not present

## 2015-04-17 DIAGNOSIS — E876 Hypokalemia: Secondary | ICD-10-CM | POA: Diagnosis not present

## 2015-04-17 DIAGNOSIS — C779 Secondary and unspecified malignant neoplasm of lymph node, unspecified: Secondary | ICD-10-CM | POA: Diagnosis not present

## 2015-04-17 DIAGNOSIS — A419 Sepsis, unspecified organism: Secondary | ICD-10-CM | POA: Diagnosis not present

## 2015-04-17 DIAGNOSIS — N184 Chronic kidney disease, stage 4 (severe): Secondary | ICD-10-CM | POA: Diagnosis not present

## 2015-04-17 DIAGNOSIS — C7931 Secondary malignant neoplasm of brain: Secondary | ICD-10-CM

## 2015-04-17 DIAGNOSIS — J9600 Acute respiratory failure, unspecified whether with hypoxia or hypercapnia: Secondary | ICD-10-CM | POA: Diagnosis not present

## 2015-04-17 DIAGNOSIS — J449 Chronic obstructive pulmonary disease, unspecified: Secondary | ICD-10-CM | POA: Diagnosis not present

## 2015-04-17 DIAGNOSIS — N189 Chronic kidney disease, unspecified: Secondary | ICD-10-CM | POA: Diagnosis not present

## 2015-04-17 DIAGNOSIS — E43 Unspecified severe protein-calorie malnutrition: Secondary | ICD-10-CM | POA: Diagnosis not present

## 2015-04-17 DIAGNOSIS — G9340 Encephalopathy, unspecified: Secondary | ICD-10-CM | POA: Diagnosis not present

## 2015-04-17 DIAGNOSIS — N179 Acute kidney failure, unspecified: Secondary | ICD-10-CM | POA: Diagnosis not present

## 2015-04-17 DIAGNOSIS — K559 Vascular disorder of intestine, unspecified: Secondary | ICD-10-CM | POA: Diagnosis not present

## 2015-04-17 DIAGNOSIS — C3490 Malignant neoplasm of unspecified part of unspecified bronchus or lung: Secondary | ICD-10-CM | POA: Diagnosis not present

## 2015-04-17 DIAGNOSIS — J9601 Acute respiratory failure with hypoxia: Secondary | ICD-10-CM | POA: Diagnosis not present

## 2015-04-17 DIAGNOSIS — Z87891 Personal history of nicotine dependence: Secondary | ICD-10-CM | POA: Diagnosis not present

## 2015-04-17 DIAGNOSIS — R6521 Severe sepsis with septic shock: Secondary | ICD-10-CM | POA: Diagnosis not present

## 2015-04-17 DIAGNOSIS — E872 Acidosis: Secondary | ICD-10-CM | POA: Diagnosis not present

## 2015-04-17 DIAGNOSIS — C7951 Secondary malignant neoplasm of bone: Secondary | ICD-10-CM | POA: Diagnosis not present

## 2015-04-17 DIAGNOSIS — D696 Thrombocytopenia, unspecified: Secondary | ICD-10-CM | POA: Diagnosis not present

## 2015-04-17 DIAGNOSIS — Z85820 Personal history of malignant melanoma of skin: Secondary | ICD-10-CM | POA: Diagnosis not present

## 2015-04-17 DIAGNOSIS — Z66 Do not resuscitate: Secondary | ICD-10-CM | POA: Diagnosis not present

## 2015-04-17 DIAGNOSIS — C787 Secondary malignant neoplasm of liver and intrahepatic bile duct: Secondary | ICD-10-CM | POA: Diagnosis not present

## 2015-04-17 DIAGNOSIS — T451X5A Adverse effect of antineoplastic and immunosuppressive drugs, initial encounter: Secondary | ICD-10-CM | POA: Diagnosis not present

## 2015-04-17 LAB — BUN AND CREATININE (CC13)
BUN: 36.8 mg/dL — ABNORMAL HIGH (ref 7.0–26.0)
Creatinine: 1.7 mg/dL — ABNORMAL HIGH (ref 0.7–1.3)
EGFR: 39 mL/min/{1.73_m2} — ABNORMAL LOW (ref >90)

## 2015-04-17 MED ORDER — GADOBENATE DIMEGLUMINE 529 MG/ML IV SOLN
8.0000 mL | Freq: Once | INTRAVENOUS | Status: AC | PRN
  Administered 2015-04-17: 8 mL via INTRAVENOUS

## 2015-04-18 ENCOUNTER — Other Ambulatory Visit: Payer: Medicare Other

## 2015-04-18 DIAGNOSIS — C7931 Secondary malignant neoplasm of brain: Secondary | ICD-10-CM | POA: Diagnosis not present

## 2015-04-19 ENCOUNTER — Inpatient Hospital Stay (HOSPITAL_COMMUNITY): Payer: Medicare Other

## 2015-04-19 ENCOUNTER — Inpatient Hospital Stay (HOSPITAL_COMMUNITY)
Admission: EM | Admit: 2015-04-19 | Discharge: 2015-05-18 | DRG: 871 | Disposition: E | Payer: Medicare Other | Attending: Pulmonary Disease | Admitting: Pulmonary Disease

## 2015-04-19 ENCOUNTER — Encounter (HOSPITAL_COMMUNITY): Payer: Self-pay

## 2015-04-19 ENCOUNTER — Encounter (HOSPITAL_COMMUNITY): Payer: Self-pay | Admitting: Emergency Medicine

## 2015-04-19 ENCOUNTER — Encounter: Payer: Self-pay | Admitting: *Deleted

## 2015-04-19 ENCOUNTER — Other Ambulatory Visit: Payer: Medicare Other

## 2015-04-19 ENCOUNTER — Emergency Department (HOSPITAL_COMMUNITY): Payer: Medicare Other

## 2015-04-19 ENCOUNTER — Ambulatory Visit: Payer: Medicare Other | Admitting: Nurse Practitioner

## 2015-04-19 DIAGNOSIS — R6521 Severe sepsis with septic shock: Secondary | ICD-10-CM | POA: Diagnosis not present

## 2015-04-19 DIAGNOSIS — K559 Vascular disorder of intestine, unspecified: Secondary | ICD-10-CM | POA: Diagnosis present

## 2015-04-19 DIAGNOSIS — I129 Hypertensive chronic kidney disease with stage 1 through stage 4 chronic kidney disease, or unspecified chronic kidney disease: Secondary | ICD-10-CM | POA: Diagnosis present

## 2015-04-19 DIAGNOSIS — C3490 Malignant neoplasm of unspecified part of unspecified bronchus or lung: Secondary | ICD-10-CM | POA: Diagnosis not present

## 2015-04-19 DIAGNOSIS — Z515 Encounter for palliative care: Secondary | ICD-10-CM | POA: Diagnosis not present

## 2015-04-19 DIAGNOSIS — J9600 Acute respiratory failure, unspecified whether with hypoxia or hypercapnia: Secondary | ICD-10-CM | POA: Diagnosis present

## 2015-04-19 DIAGNOSIS — A419 Sepsis, unspecified organism: Secondary | ICD-10-CM | POA: Diagnosis not present

## 2015-04-19 DIAGNOSIS — J449 Chronic obstructive pulmonary disease, unspecified: Secondary | ICD-10-CM | POA: Diagnosis present

## 2015-04-19 DIAGNOSIS — E876 Hypokalemia: Secondary | ICD-10-CM | POA: Diagnosis not present

## 2015-04-19 DIAGNOSIS — R5081 Fever presenting with conditions classified elsewhere: Secondary | ICD-10-CM | POA: Diagnosis not present

## 2015-04-19 DIAGNOSIS — E43 Unspecified severe protein-calorie malnutrition: Secondary | ICD-10-CM | POA: Diagnosis present

## 2015-04-19 DIAGNOSIS — E872 Acidosis: Secondary | ICD-10-CM | POA: Diagnosis present

## 2015-04-19 DIAGNOSIS — C787 Secondary malignant neoplasm of liver and intrahepatic bile duct: Secondary | ICD-10-CM | POA: Diagnosis not present

## 2015-04-19 DIAGNOSIS — Z0189 Encounter for other specified special examinations: Secondary | ICD-10-CM

## 2015-04-19 DIAGNOSIS — D709 Neutropenia, unspecified: Secondary | ICD-10-CM

## 2015-04-19 DIAGNOSIS — C7951 Secondary malignant neoplasm of bone: Secondary | ICD-10-CM | POA: Diagnosis not present

## 2015-04-19 DIAGNOSIS — Z452 Encounter for adjustment and management of vascular access device: Secondary | ICD-10-CM | POA: Diagnosis not present

## 2015-04-19 DIAGNOSIS — N179 Acute kidney failure, unspecified: Secondary | ICD-10-CM | POA: Diagnosis not present

## 2015-04-19 DIAGNOSIS — C4351 Malignant melanoma of anal skin: Secondary | ICD-10-CM | POA: Diagnosis not present

## 2015-04-19 DIAGNOSIS — T451X5A Adverse effect of antineoplastic and immunosuppressive drugs, initial encounter: Secondary | ICD-10-CM | POA: Diagnosis present

## 2015-04-19 DIAGNOSIS — Z9221 Personal history of antineoplastic chemotherapy: Secondary | ICD-10-CM

## 2015-04-19 DIAGNOSIS — R918 Other nonspecific abnormal finding of lung field: Secondary | ICD-10-CM | POA: Diagnosis not present

## 2015-04-19 DIAGNOSIS — E119 Type 2 diabetes mellitus without complications: Secondary | ICD-10-CM | POA: Diagnosis present

## 2015-04-19 DIAGNOSIS — D696 Thrombocytopenia, unspecified: Secondary | ICD-10-CM | POA: Diagnosis present

## 2015-04-19 DIAGNOSIS — Z4682 Encounter for fitting and adjustment of non-vascular catheter: Secondary | ICD-10-CM | POA: Diagnosis not present

## 2015-04-19 DIAGNOSIS — J9601 Acute respiratory failure with hypoxia: Secondary | ICD-10-CM | POA: Diagnosis not present

## 2015-04-19 DIAGNOSIS — Z4659 Encounter for fitting and adjustment of other gastrointestinal appliance and device: Secondary | ICD-10-CM

## 2015-04-19 DIAGNOSIS — N184 Chronic kidney disease, stage 4 (severe): Secondary | ICD-10-CM | POA: Diagnosis present

## 2015-04-19 DIAGNOSIS — Z85820 Personal history of malignant melanoma of skin: Secondary | ICD-10-CM | POA: Diagnosis not present

## 2015-04-19 DIAGNOSIS — Z87891 Personal history of nicotine dependence: Secondary | ICD-10-CM | POA: Diagnosis not present

## 2015-04-19 DIAGNOSIS — N189 Chronic kidney disease, unspecified: Secondary | ICD-10-CM | POA: Diagnosis present

## 2015-04-19 DIAGNOSIS — G9340 Encephalopathy, unspecified: Secondary | ICD-10-CM | POA: Diagnosis not present

## 2015-04-19 DIAGNOSIS — C7931 Secondary malignant neoplasm of brain: Secondary | ICD-10-CM | POA: Diagnosis present

## 2015-04-19 DIAGNOSIS — Z6825 Body mass index (BMI) 25.0-25.9, adult: Secondary | ICD-10-CM

## 2015-04-19 DIAGNOSIS — Z79899 Other long term (current) drug therapy: Secondary | ICD-10-CM | POA: Diagnosis not present

## 2015-04-19 DIAGNOSIS — C779 Secondary and unspecified malignant neoplasm of lymph node, unspecified: Secondary | ICD-10-CM | POA: Diagnosis present

## 2015-04-19 DIAGNOSIS — Z66 Do not resuscitate: Secondary | ICD-10-CM | POA: Diagnosis present

## 2015-04-19 DIAGNOSIS — D701 Agranulocytosis secondary to cancer chemotherapy: Secondary | ICD-10-CM | POA: Diagnosis not present

## 2015-04-19 LAB — BLOOD GAS, ARTERIAL
Acid-base deficit: 3.6 mmol/L — ABNORMAL HIGH (ref 0.0–2.0)
Bicarbonate: 21.5 mEq/L (ref 20.0–24.0)
Drawn by: 103701
FIO2: 1
O2 SAT: 99 %
PEEP/CPAP: 5 cmH2O
PH ART: 7.312 — AB (ref 7.350–7.450)
PO2 ART: 538 mmHg — AB (ref 80.0–100.0)
Patient temperature: 101.6
RATE: 14 resp/min
TCO2: 19.4 mmol/L (ref 0–100)
VT: 530 mL
pCO2 arterial: 44.9 mmHg (ref 35.0–45.0)

## 2015-04-19 LAB — COMPREHENSIVE METABOLIC PANEL
ALK PHOS: 66 U/L (ref 38–126)
ALT: 32 U/L (ref 17–63)
AST: 43 U/L — AB (ref 15–41)
Albumin: 3.6 g/dL (ref 3.5–5.0)
Anion gap: 18 — ABNORMAL HIGH (ref 5–15)
BUN: 51 mg/dL — AB (ref 6–20)
CALCIUM: 8.3 mg/dL — AB (ref 8.9–10.3)
CHLORIDE: 94 mmol/L — AB (ref 101–111)
CO2: 24 mmol/L (ref 22–32)
Creatinine, Ser: 2.71 mg/dL — ABNORMAL HIGH (ref 0.61–1.24)
GFR calc Af Amer: 25 mL/min — ABNORMAL LOW (ref >60)
GFR calc non Af Amer: 21 mL/min — ABNORMAL LOW (ref >60)
GLUCOSE: 187 mg/dL — AB (ref 65–99)
Potassium: 3.3 mmol/L — ABNORMAL LOW (ref 3.5–5.1)
Sodium: 136 mmol/L (ref 135–145)
Total Bilirubin: 1.8 mg/dL — ABNORMAL HIGH (ref 0.3–1.2)
Total Protein: 7.2 g/dL (ref 6.5–8.1)

## 2015-04-19 LAB — CBC WITH DIFFERENTIAL/PLATELET
BASOS ABS: 0 10*3/uL (ref 0.0–0.1)
Basophils Relative: 1 % (ref 0–1)
EOS ABS: 0 10*3/uL (ref 0.0–0.7)
Eosinophils Relative: 1 % (ref 0–5)
HCT: 46.1 % (ref 39.0–52.0)
Hemoglobin: 15.6 g/dL (ref 13.0–17.0)
LYMPHS PCT: 73 % — AB (ref 12–46)
Lymphs Abs: 0.5 10*3/uL — ABNORMAL LOW (ref 0.7–4.0)
MCH: 31 pg (ref 26.0–34.0)
MCHC: 33.8 g/dL (ref 30.0–36.0)
MCV: 91.5 fL (ref 78.0–100.0)
Monocytes Absolute: 0.2 10*3/uL (ref 0.1–1.0)
Monocytes Relative: 25 % — ABNORMAL HIGH (ref 3–12)
Neutro Abs: 0 10*3/uL — ABNORMAL LOW (ref 1.7–7.7)
Neutrophils Relative %: 0 % — ABNORMAL LOW (ref 43–77)
Platelets: 102 10*3/uL — ABNORMAL LOW (ref 150–400)
RBC: 5.04 MIL/uL (ref 4.22–5.81)
RDW: 12.7 % (ref 11.5–15.5)
WBC: 0.7 10*3/uL — CL (ref 4.0–10.5)

## 2015-04-19 LAB — PROTIME-INR
INR: 1.36 (ref 0.00–1.49)
PROTHROMBIN TIME: 16.9 s — AB (ref 11.6–15.2)

## 2015-04-19 LAB — AMYLASE: Amylase: 45 U/L (ref 28–100)

## 2015-04-19 LAB — I-STAT CG4 LACTIC ACID, ED: Lactic Acid, Venous: 6.65 mmol/L (ref 0.5–2.0)

## 2015-04-19 LAB — URINALYSIS, ROUTINE W REFLEX MICROSCOPIC
GLUCOSE, UA: NEGATIVE mg/dL
Ketones, ur: NEGATIVE mg/dL
LEUKOCYTES UA: NEGATIVE
NITRITE: NEGATIVE
PROTEIN: 30 mg/dL — AB
Specific Gravity, Urine: 1.017 (ref 1.005–1.030)
Urobilinogen, UA: 0.2 mg/dL (ref 0.0–1.0)
pH: 5 (ref 5.0–8.0)

## 2015-04-19 LAB — ABO/RH: ABO/RH(D): A POS

## 2015-04-19 LAB — GLUCOSE, CAPILLARY: Glucose-Capillary: 204 mg/dL — ABNORMAL HIGH (ref 65–99)

## 2015-04-19 LAB — APTT: APTT: 33 s (ref 24–37)

## 2015-04-19 LAB — TYPE AND SCREEN
ABO/RH(D): A POS
Antibody Screen: NEGATIVE

## 2015-04-19 LAB — CORTISOL: Cortisol, Plasma: 92.2 ug/dL

## 2015-04-19 LAB — TROPONIN I: TROPONIN I: 0.05 ng/mL — AB (ref <0.031)

## 2015-04-19 LAB — POC OCCULT BLOOD, ED: FECAL OCCULT BLD: POSITIVE — AB

## 2015-04-19 LAB — PHOSPHORUS: Phosphorus: 1.6 mg/dL — ABNORMAL LOW (ref 2.5–4.6)

## 2015-04-19 LAB — MRSA PCR SCREENING: MRSA BY PCR: NEGATIVE

## 2015-04-19 LAB — URINE MICROSCOPIC-ADD ON

## 2015-04-19 LAB — FIBRINOGEN: Fibrinogen: 684 mg/dL — ABNORMAL HIGH (ref 204–475)

## 2015-04-19 LAB — PROCALCITONIN: PROCALCITONIN: 15.2 ng/mL

## 2015-04-19 LAB — LACTIC ACID, PLASMA: Lactic Acid, Venous: 2.7 mmol/L (ref 0.5–2.0)

## 2015-04-19 MED ORDER — ASPIRIN 300 MG RE SUPP
300.0000 mg | RECTAL | Status: AC
  Administered 2015-04-19: 300 mg via RECTAL
  Filled 2015-04-19: qty 1

## 2015-04-19 MED ORDER — ROCURONIUM BROMIDE 50 MG/5ML IV SOLN
INTRAVENOUS | Status: AC
  Administered 2015-04-19: 80 mg
  Filled 2015-04-19: qty 2

## 2015-04-19 MED ORDER — PANTOPRAZOLE SODIUM 40 MG IV SOLR
40.0000 mg | Freq: Every day | INTRAVENOUS | Status: DC
  Administered 2015-04-19 – 2015-04-20 (×2): 40 mg via INTRAVENOUS
  Filled 2015-04-19 (×2): qty 40

## 2015-04-19 MED ORDER — LIDOCAINE HCL (CARDIAC) 20 MG/ML IV SOLN
INTRAVENOUS | Status: AC
  Filled 2015-04-19: qty 5

## 2015-04-19 MED ORDER — ACETAMINOPHEN 160 MG/5ML PO SOLN
650.0000 mg | Freq: Four times a day (QID) | ORAL | Status: DC | PRN
  Administered 2015-04-20: 650 mg
  Filled 2015-04-19 (×3): qty 20.3

## 2015-04-19 MED ORDER — SODIUM CHLORIDE 0.9 % IV SOLN
25.0000 ug/h | INTRAVENOUS | Status: DC
  Filled 2015-04-19: qty 50

## 2015-04-19 MED ORDER — PIPERACILLIN-TAZOBACTAM 3.375 G IVPB 30 MIN: 3.3750 g | Freq: Once | INTRAVENOUS | Status: DC

## 2015-04-19 MED ORDER — CHLORHEXIDINE GLUCONATE 0.12 % MT SOLN
15.0000 mL | Freq: Two times a day (BID) | OROMUCOSAL | Status: DC
  Administered 2015-04-19 – 2015-04-20 (×3): 15 mL via OROMUCOSAL
  Filled 2015-04-19 (×5): qty 15

## 2015-04-19 MED ORDER — FENTANYL CITRATE (PF) 100 MCG/2ML IJ SOLN
50.0000 ug | INTRAMUSCULAR | Status: DC | PRN
  Administered 2015-04-19: 50 ug via INTRAVENOUS
  Filled 2015-04-19: qty 2

## 2015-04-19 MED ORDER — VANCOMYCIN HCL IN DEXTROSE 1-5 GM/200ML-% IV SOLN: 1000.0000 mg | Freq: Once | INTRAVENOUS | Status: DC

## 2015-04-19 MED ORDER — SODIUM CHLORIDE 0.9 % IV SOLN
25.0000 ug/h | INTRAVENOUS | Status: DC
  Administered 2015-04-19: 100 ug/h via INTRAVENOUS
  Filled 2015-04-19 (×5): qty 50

## 2015-04-19 MED ORDER — DEXTROSE IN LACTATED RINGERS 5 % IV SOLN
INTRAVENOUS | Status: DC
  Administered 2015-04-19: 17:00:00 via INTRAVENOUS

## 2015-04-19 MED ORDER — SODIUM CHLORIDE 0.9 % IV BOLUS (SEPSIS)
1000.0000 mL | INTRAVENOUS | Status: AC
  Administered 2015-04-19 (×2): 1000 mL via INTRAVENOUS

## 2015-04-19 MED ORDER — VITAL HIGH PROTEIN PO LIQD: 1000.0000 mL | ORAL | Status: DC

## 2015-04-19 MED ORDER — HYDROCORTISONE NA SUCCINATE PF 100 MG IJ SOLR
50.0000 mg | Freq: Four times a day (QID) | INTRAMUSCULAR | Status: DC
  Administered 2015-04-19 – 2015-04-20 (×5): 50 mg via INTRAVENOUS
  Filled 2015-04-19 (×6): qty 2

## 2015-04-19 MED ORDER — LORAZEPAM 2 MG/ML IJ SOLN
2.0000 mg | INTRAMUSCULAR | Status: DC | PRN
  Administered 2015-04-19: 2 mg via INTRAVENOUS
  Filled 2015-04-19: qty 1

## 2015-04-19 MED ORDER — VANCOMYCIN HCL IN DEXTROSE 750-5 MG/150ML-% IV SOLN
750.0000 mg | INTRAVENOUS | Status: DC
  Administered 2015-04-20: 750 mg via INTRAVENOUS
  Filled 2015-04-19 (×2): qty 150

## 2015-04-19 MED ORDER — ROCURONIUM BROMIDE 50 MG/5ML IV SOLN
80.0000 mg | INTRAVENOUS | Status: DC | PRN
  Filled 2015-04-19: qty 8

## 2015-04-19 MED ORDER — FENTANYL BOLUS VIA INFUSION
25.0000 ug | INTRAVENOUS | Status: DC | PRN
  Administered 2015-04-19: 25 ug via INTRAVENOUS
  Filled 2015-04-19: qty 25

## 2015-04-19 MED ORDER — VITAL HIGH PROTEIN PO LIQD
1000.0000 mL | ORAL | Status: DC
  Administered 2015-04-19: 1000 mL
  Filled 2015-04-19 (×2): qty 1000

## 2015-04-19 MED ORDER — ETOMIDATE 2 MG/ML IV SOLN
INTRAVENOUS | Status: AC
  Administered 2015-04-19: 25 mg
  Filled 2015-04-19: qty 20

## 2015-04-19 MED ORDER — FENTANYL CITRATE (PF) 100 MCG/2ML IJ SOLN
50.0000 ug | INTRAMUSCULAR | Status: AC | PRN
  Administered 2015-04-19 (×3): 50 ug via INTRAVENOUS
  Filled 2015-04-19: qty 2

## 2015-04-19 MED ORDER — ASPIRIN 81 MG PO CHEW: 324.0000 mg | CHEWABLE_TABLET | ORAL | Status: AC

## 2015-04-19 MED ORDER — DEXTROSE 5 % IV SOLN
30.0000 ug/min | INTRAVENOUS | Status: DC
  Administered 2015-04-19: 75 ug/min via INTRAVENOUS
  Administered 2015-04-20 (×2): 200 ug/min via INTRAVENOUS
  Administered 2015-04-20: 190 ug/min via INTRAVENOUS
  Administered 2015-04-20 (×3): 200 ug/min via INTRAVENOUS
  Filled 2015-04-19 (×7): qty 4

## 2015-04-19 MED ORDER — NOREPINEPHRINE BITARTRATE 1 MG/ML IV SOLN
5.0000 ug/min | INTRAVENOUS | Status: DC
  Administered 2015-04-19: 5 ug/min via INTRAVENOUS
  Administered 2015-04-20: 18 ug/min via INTRAVENOUS
  Filled 2015-04-19 (×3): qty 4

## 2015-04-19 MED ORDER — PHENYLEPHRINE HCL 10 MG/ML IJ SOLN
30.0000 ug/min | INTRAVENOUS | Status: DC
  Administered 2015-04-19: 30 ug/min via INTRAVENOUS
  Filled 2015-04-19: qty 1

## 2015-04-19 MED ORDER — SUCCINYLCHOLINE CHLORIDE 20 MG/ML IJ SOLN
INTRAMUSCULAR | Status: AC
  Administered 2015-04-19: 120 mg
  Filled 2015-04-19: qty 1

## 2015-04-19 MED ORDER — PIPERACILLIN-TAZOBACTAM 3.375 G IVPB
3.3750 g | Freq: Three times a day (TID) | INTRAVENOUS | Status: DC
  Administered 2015-04-19 – 2015-04-20 (×4): 3.375 g via INTRAVENOUS
  Filled 2015-04-19 (×4): qty 50

## 2015-04-19 MED ORDER — SODIUM CHLORIDE 0.9 % IV SOLN: 250.0000 mL | INTRAVENOUS | Status: DC | PRN

## 2015-04-19 MED ORDER — FENTANYL CITRATE (PF) 100 MCG/2ML IJ SOLN
50.0000 ug | Freq: Once | INTRAMUSCULAR | Status: DC
  Filled 2015-04-19: qty 2

## 2015-04-19 MED ORDER — PIPERACILLIN-TAZOBACTAM 3.375 G IVPB
3.3750 g | Freq: Once | INTRAVENOUS | Status: AC
  Administered 2015-04-19: 3.375 g via INTRAVENOUS
  Filled 2015-04-19: qty 50

## 2015-04-19 MED ORDER — CHLORHEXIDINE GLUCONATE 0.12 % MT SOLN: 15.0000 mL | Freq: Two times a day (BID) | OROMUCOSAL | Status: DC

## 2015-04-19 MED ORDER — CETYLPYRIDINIUM CHLORIDE 0.05 % MT LIQD
7.0000 mL | Freq: Four times a day (QID) | OROMUCOSAL | Status: DC
  Administered 2015-04-19 – 2015-04-20 (×5): 7 mL via OROMUCOSAL

## 2015-04-19 MED ORDER — SODIUM CHLORIDE 0.9 % IV BOLUS (SEPSIS)
1000.0000 mL | Freq: Once | INTRAVENOUS | Status: AC
  Administered 2015-04-19: 1000 mL via INTRAVENOUS

## 2015-04-19 MED ORDER — ACETAMINOPHEN 650 MG RE SUPP
975.0000 mg | Freq: Once | RECTAL | Status: AC
  Administered 2015-04-19: 975 mg via RECTAL
  Filled 2015-04-19 (×2): qty 1

## 2015-04-19 MED ORDER — SODIUM CHLORIDE 0.9 % IV BOLUS (SEPSIS): 500.0000 mL | INTRAVENOUS | Status: AC

## 2015-04-19 MED ORDER — CETYLPYRIDINIUM CHLORIDE 0.05 % MT LIQD: 7.0000 mL | Freq: Four times a day (QID) | OROMUCOSAL | Status: DC

## 2015-04-19 MED ORDER — VANCOMYCIN HCL IN DEXTROSE 1-5 GM/200ML-% IV SOLN
1000.0000 mg | Freq: Once | INTRAVENOUS | Status: AC
  Administered 2015-04-19: 1000 mg via INTRAVENOUS
  Filled 2015-04-19: qty 200

## 2015-04-19 NOTE — H&P (Signed)
PULMONARY / CRITICAL CARE MEDICINE   Name: Todd Poole MRN: [id number] DOB: 11-27-1940    ADMISSION DATE:  05/11/2015 CONSULTATION DATE:  [name]  REFERRING MD :  Cathleen Fears   CHIEF COMPLAINT:  Altered MS,. sepsis  INITIAL PRESENTATION:  74 year old male w/ stage IV lung cancer (last chem 7/28). Admitted on 8/3 w/ delirium, agitation, temp >104 and hypoxic w/ sats <80. PCCM asked to admit,  STUDIES:   SIGNIFICANT EVENTS:    HISTORY OF PRESENT ILLNESS:   74 year old male w/ extensive metastatic disease with dominant right retro hilar mass which may reflect primary bronchogenic carcinoma. Metastases are present within multiple lymph nodes, the right lung/pleural space, the liver, the right adrenal gland, the bones, soft tissues and brain. Last chemotherapy was on 7/38. Admitted on 8/3 w/ progressive lethargy. On presentation he was confused, lethargic, febrile w/ temp > 104 and hypoxic w/ sats <80. He was intubated, PCCM asked to admit.   PAST MEDICAL HISTORY :   has a past medical history of COPD (chronic obstructive pulmonary disease); Melanoma; Hypertension; Diabetes mellitus without complication; Carotid artery occlusion; Arthritis; Brain cancer (03/24/15); Cancer (03/29/15 bx); and Bone cancer.  has past surgical history that includes Tonsillectomy; Carotid stent (Right, December 03, 2013); carotid angiogram (Bilateral, 11/09/2013); and carotid stent insertion (Right, 12/03/2013). Prior to Admission medications   Medication Sig Start Date End Date Taking? Authorizing Provider  aspirin 81 MG tablet Take 81 mg by mouth daily.    Historical Provider, MD  clopidogrel (PLAVIX) 75 MG tablet TAKE ONE TABLET BY MOUTH ONCE DAILY 03/23/15   Elam Dutch, MD  hydrochlorothiazide (HYDRODIURIL) 25 MG tablet TAKE ONE TABLET BY MOUTH ONCE DAILY 05/31/14   [name], MD  prochlorperazine (COMPAZINE) 10 MG tablet Take 1 tablet (10 mg total) by mouth every 6 (six) hours as needed for nausea or  vomiting. 04/06/15   Curt Bears, MD  simvastatin (ZOCOR) 5 MG tablet TAKE ONE TABLET BY MOUTH AT BEDTIME 08/09/14   [name], MD  SYMBICORT 160-4.5 MCG/ACT inhaler Inhale 1 puff into the lungs daily. 02/08/15   Historical Provider, MD  VENTOLIN HFA 108 (90 BASE) MCG/ACT inhaler Inhale 2 puffs into the lungs every 6 (six) hours as needed for wheezing or shortness of breath.  11/15/14   Historical Provider, MD   No Known Allergies  FAMILY HISTORY:  indicated that his mother is deceased. He indicated that his father is deceased.  SOCIAL HISTORY:  reports that he quit smoking about 7 years ago. His smoking use included Cigarettes. He has a 150 pack-year smoking history. He has never used smokeless tobacco. He reports that he does not drink alcohol or use illicit drugs.  REVIEW OF SYSTEMS:  Unable   SUBJECTIVE:  Critically ill  VITAL SIGNS: Temp:  [102.4 F (39.1 C)-104.7 F (40.4 C)] 102.4 F (39.1 C) (08/03 1443) Pulse Rate:  [138] 138 (08/03 1323) Resp:  [14-18] 14 (08/03 1443) BP: (93-125)/(55-79) 93/62 mmHg (08/03 1430) SpO2:  [80 %-100 %] 100 % (08/03 1445) Weight:  [78.926 kg (174 lb)-79.379 kg (175 lb)] 79.379 kg (175 lb) (08/03 1351) HEMODYNAMICS:   VENTILATOR SETTINGS:   INTAKE / OUTPUT: No intake or output data in the 24 hours ending 04/22/2015 1500  PHYSICAL EXAMINATION: General:  Critically ill appearing white male, now sedated amd unresponsive  Neuro:  Localized prior to sedation. Now reaching up to ETT  HEENT:  Orally intubated poor dentition  Cardiovascular:  Tachy rrr Lungs:  Diffuse rhonchi  Abdomen:  Soft, no OM + bowel sounds  Musculoskeletal:  Generalized weakness  Skin:  Diffusely mottled   LABS:  CBC  Recent Labs Lab 04/13/15 1157  WBC 23.3*  HGB 16.0  HCT 47.4  PLT 320   Coag's No results for input(s): APTT, INR in the last 168 hours. BMET  Recent Labs Lab 04/13/15 1157 04/17/15 0846 05/06/2015 1340  NA 134*  --  136  K 3.3*  --   3.3*  CL  --   --  94*  CO2 25  --  24  BUN 42.0* 36.8* 51*  CREATININE 2.2* 1.7* 2.71*  GLUCOSE 150*  --  187*   Electrolytes  Recent Labs Lab 04/13/15 1157 05/11/2015 1340  CALCIUM 9.6 8.3*   Sepsis Markers  Recent Labs Lab 04/26/2015 1355  LATICACIDVEN 6.65*   ABG No results for input(s): PHART, PCO2ART, PO2ART in the last 168 hours. Liver Enzymes  Recent Labs Lab 04/13/15 1157 05/10/2015 1340  AST 19 43*  ALT 17 32  ALKPHOS 84 66  BILITOT 0.64 1.8*  ALBUMIN 3.7 3.6   Cardiac Enzymes No results for input(s): TROPONINI, PROBNP in the last 168 hours. Glucose No results for input(s): GLUCAP in the last 168 hours.  Imaging Dg Chest Port 1 View  05/02/2015   CLINICAL DATA:  Hypoxia  EXAM: PORTABLE CHEST - 1 VIEW  COMPARISON:  Feb 09, 2015 chest radiograph; chest CT February 24, 2015  FINDINGS: Endotracheal tube tip is 4.3 cm above the carina. No pneumothorax. There is no edema or consolidation. There is a mass arising in the posterior segment of the right upper lobe which extends into and appears to invade the mediastinum. Heart size and pulmonary vascularity are within normal limits. No bone lesions.  IMPRESSION: Endotracheal tube as described without pneumothorax. Mass in the right upper lobe region extending into and apparently invading the mediastinum. This finding is much better appreciated on CT. No new opacity. No change in cardiac silhouette.   Electronically Signed   By: Lowella Grip III M.D.   On: 04/23/2015 14:50   Dg Abd Portable 1v  05/05/2015   CLINICAL DATA:  Orogastric tube placement  EXAM: PORTABLE ABDOMEN - 1 VIEW  COMPARISON:  None.  FINDINGS: Orogastric tube not seen. Stomach distended with air. No small or large bowel dilatation. No free air. Lung bases clear.  IMPRESSION: Orogastric tube not seen in the abdominal region. Stomach distended with fluid and air. Bowel gas pattern otherwise unremarkable. Lung bases clear.   Electronically Signed   By: Lowella Grip III M.D.   On: 05/12/2015 14:47     ASSESSMENT / PLAN:  PULMONARY OETT 8/3 A: Acute hypoxic respiratory failure (room air sats in 70s on admit. 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 Source unclear: ? UT or GI tract.  P:   BCx2 8/3>>> UC 8/3>>> Sputum 8/3>>> Zosyn 8/3>>> vanc 8/3>>>  ENDOCRINE A:   Risk of adrenal insuff   Hyperglycemia  P:   Ck cortisol  Start solucortef empirically; would stop of random cortisol < 20  ssi for glycemic control   NEUROLOGIC A:  Acute encephalopathy ->likely d/t sepsis and hypoxemia  Brain Mets  P:   RASS goal: -2 PAD protocol  Supportive care    FAMILY  - Updates: updated his son [name] at bedside. Goals of care discussed.   - Inter-disciplinary family meet or Palliative Care meeting due by: completed on admission w/ ICU and ER team as well as pt's son [name]. Goals of care discussed. Advanced directives reviewed. He wants aggressive care but would not want CPR should his condition worsen. He does agree to pressors and central access. The son understands that Mr Mittelman is critically ill.     TODAY'S SUMMARY: sepsis/subclinical shock. Source unclear. Resuscitate,  man culture andc ont support.  He is DNR if has cardiac arrest on current mode.    Erick Colace ACNP-BC [place] Pager # [phone] OR # [phone] if no answer    04/17/2015, 3:00 PM

## 2015-04-19 NOTE — Progress Notes (Signed)
[place] Work  Clinical Social Work was referred by [facility] navigator for assessment of psychosocial needs.  Clinical Social Worker contacted patient's son, [name] this morning.  [name] shared that his father is having difficulty ambulating and feels very fatigued.  CSW and patient's son discussed possible resources available to assist patient including home care, transportation, and community support.  CSW encouraged patient's son to bring patient to appointment this afternoon to be evaluated and address symptoms.  CSW planned to meet with patient/son at today's medical oncology visit.    Per chart review, patient was sent to ED and is being admitted.  CSW will follow up with patient/son once patient is stabilized.  Polo Riley, MSW, LCSW, OSW-C Clinical Social Worker [facility] [phone]

## 2015-04-19 NOTE — Progress Notes (Signed)
ANTIBIOTIC CONSULT NOTE - INITIAL  Pharmacy Consult for Vancomycin, Zosyn Indication: sepsis  No Known Allergies  Patient Measurements: Height: '5\' 10"'$  (177.8 cm) Weight: 175 lb (79.379 kg) IBW/kg (Calculated) : 73  Vital Signs: Temp: 104.7 F (40.4 C) (08/03 1323) Temp Source: Rectal (08/03 1323) BP: 103/55 mmHg (08/03 1415) Pulse Rate: 138 (08/03 1323) Intake/Output from previous day:   Intake/Output from this shift:    Labs:  Recent Labs  04/17/15 0846 05/02/2015 1340  CREATININE 1.7* 2.71*   Estimated Creatinine Clearance: 23.9 mL/min (by C-G formula based on Cr of 2.71). No results for input(s): VANCOTROUGH, VANCOPEAK, VANCORANDOM, GENTTROUGH, GENTPEAK, GENTRANDOM, TOBRATROUGH, TOBRAPEAK, TOBRARND, AMIKACINPEAK, AMIKACINTROU, AMIKACIN in the last 72 hours.   Microbiology: No results found for this or any previous visit (from the past 720 hour(s)).  Medical History: Past Medical History  Diagnosis Date  . COPD (chronic obstructive pulmonary disease)   . Melanoma   . Hypertension   . Diabetes mellitus without complication   . Carotid artery occlusion   . Arthritis     Lower Back  . 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 Bone cancer      Assessment: 36 YOM with stage IV NSLC diagnosed in July '16. Started carbo/paclitaxel with first chemo given 7/28. Presents with mental status changes, fever and shortness of breath. Admitted with sepsis.  Vanc 1g and Zosyn 3.375g one time doses given in ED.  Fever of 104.7, WBC pending, AKI with SCr 2.71, CrCl~25 ml/min (CG), ~23 ml/min (normalized)  8/3 >> vanco >> 8/3 >> zosyn >>   8/3 blood: sent 8/3 urine: sent  Goal of Therapy:  Vancomycin trough level 15-20 mcg/ml  Doses adjusted per renal function Eradication of infection  Plan:  1.  Vancomycin 750 mg IV q24h. 2.  Zosyn 3.375g IV q8h (4 hour infusion time).  3.  F/u culture results, SCr, vanc levels as  indicated.    Hershal Coria 05/12/2015,2:41 PM

## 2015-04-19 NOTE — ED Notes (Signed)
Pt BIB son after attempting to go to a cancer center appt.  Cancer center visualized pt to be altered.  Pt mottled and labored in breathing upon arrival to ED.  Son has talked to family and they have agreed to do full code at this point.  Pt being intubated by Dr. Winfred Leeds. Dr at bedside.

## 2015-04-19 NOTE — Progress Notes (Signed)
eLink Physician-Brief Progress Note Patient Name: Todd Poole DOB: 15-Dec-1940 MRN: [id number]   Date of Service  05/03/2015  HPI/Events of Note  Called by nursing for multiple issues   eICU Interventions  1)  shock state, on levophed, no CVL, change to neo, give another liter fluid, repeat lactic acid; may need CVL 2) nutrition > OK to start 3) Sedatin> continue intermittent sedation per PAD protocol     Intervention Category Major Interventions: Hypotension - evaluation and management;Sepsis - evaluation and management;Shock - evaluation and management  Lawrnce Reyez 05/05/2015, 4:45 PM

## 2015-04-19 NOTE — Procedures (Signed)
Arterial Catheter Insertion Procedure Note Todd Poole [id number] 12/11/1940  Procedure: Insertion of Arterial Catheter  Indications: Blood pressure monitoring and Frequent blood sampling  Procedure Details Consent: Risks of procedure as well as the alternatives and risks of each were explained to the (patient/caregiver).  Consent for procedure obtained. Time Out: Verified patient identification, verified procedure, site/side was marked, verified correct patient position, special equipment/implants available, medications/allergies/relevent history reviewed, required imaging and test results available.  Performed  Maximum sterile technique was used including antiseptics, cap, gloves, gown, hand hygiene, mask and sheet. Skin prep: Chlorhexidine; local anesthetic administered 20 gauge catheter was inserted into right radial artery using the Seldinger technique.  Evaluation Blood flow good; BP tracing good. Complications: No apparent complications.   Todd Poole 05/04/2015

## 2015-04-19 NOTE — Progress Notes (Signed)
[facility] Progress Note Patient Name: Todd Poole DOB: [birth year] MRN: [id number]   Date of Service  04/26/2015  HPI/Events of Note  Agitated on vent  eICU Interventions  Fentanyl gtt     Intervention Category Intermediate Interventions: Change in mental status - evaluation and management  Ardella Chhim 05/03/2015, 6:24 PM

## 2015-04-19 NOTE — ED Provider Notes (Signed)
CSN: [id number]     Arrival date & time 04/28/2015  1322 History   First MD Initiated Contact with Patient 04/26/2015 1329    Seen upon arrival to the ED Chief Complaint  Patient presents with  . Altered Mental Status    Level V caveat altered mental status, unstable vital signs. History is obtained from son who accompanies him (Consider location/radiation/quality/duration/timing/severity/associated sxs/prior Treatment) HPI Patient has been progressively more sleepy since yesterday. He was taken to [facility] for routine blood draw by son today. Upon arrival there it was recognized the patient was acutely ill. They suggested immediate transport to the emergency department. No treatment prior to coming here. Patient noncommunicative. Patient currently receiving chemotherapy for lung cancer. Past Medical History  Diagnosis Date  . COPD (chronic obstructive pulmonary disease)   . Melanoma   . Hypertension   . Diabetes mellitus without complication   . Carotid artery occlusion   . Arthritis     Lower Back  . 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 Bone cancer    Past Surgical History  Procedure Laterality Date  . Tonsillectomy      at age 32 yrs old  . Carotid stent Right December 03, 2013  . Carotid angiogram Bilateral 11/09/2013    Procedure: CAROTID ANGIOGRAM;  Surgeon: Serafina Mitchell, MD;  Location: [facility] CATH LAB;  Service: Cardiovascular;  Laterality: Bilateral;  . Carotid stent insertion Right 12/03/2013    Procedure: CAROTID STENT INSERTION;  Surgeon: Elam Dutch, MD;  Location: [facility] CATH LAB;  Service: Cardiovascular;  Laterality: Right;   Family History  Problem Relation Age of Onset  . Dementia Father   . Kidney disease Father     Kidney Stones   History  Substance Use Topics  . Smoking status: Former Smoker -- 3.00 packs/day for 50 years    Types: Cigarettes    Quit date: 09/17/2007  . Smokeless tobacco: Never Used  .  Alcohol Use: No    Review of Systems  Unable to perform ROS: Unstable vital signs      Allergies  Review of patient's allergies indicates no known allergies.  Home Medications   Prior to Admission medications   Medication Sig Start Date End Date Taking? Authorizing Provider  aspirin 81 MG tablet Take 81 mg by mouth daily.    Historical Provider, MD  clopidogrel (PLAVIX) 75 MG tablet TAKE ONE TABLET BY MOUTH ONCE DAILY 03/23/15   Elam Dutch, MD  hydrochlorothiazide (HYDRODIURIL) 25 MG tablet TAKE ONE TABLET BY MOUTH ONCE DAILY 05/31/14   [name], MD  prochlorperazine (COMPAZINE) 10 MG tablet Take 1 tablet (10 mg total) by mouth every 6 (six) hours as needed for nausea or vomiting. 04/06/15   Curt Bears, MD  simvastatin (ZOCOR) 5 MG tablet TAKE ONE TABLET BY MOUTH AT BEDTIME 08/09/14   [name], MD  SYMBICORT 160-4.5 MCG/ACT inhaler Inhale 1 puff into the lungs daily. 02/08/15   Historical Provider, MD  VENTOLIN HFA 108 (90 BASE) MCG/ACT inhaler Inhale 2 puffs into the lungs every 6 (six) hours as needed for wheezing or shortness of breath.  11/15/14   Historical Provider, MD   BP 110/70 mmHg  Pulse 138  Temp(Src) 104.7 F (40.4 C) (Rectal)  Ht '5\' 10"'$  (1.778 m)  Wt 175 lb (79.379 kg)  BMI 25.11 kg/m2  SpO2 80% Physical Exam  Constitutional: He appears  distressed.  Acutely and chronically ill-appearing  HENT:  Head: Normocephalic and atraumatic.  Eyes: Conjunctivae are normal. Pupils are equal, round, and reactive to light.  Neck: Neck supple. No tracheal deviation present. No thyromegaly present.  Cardiovascular: Normal rate and regular rhythm.   No murmur heard. Pulmonary/Chest: He is in respiratory distress.  Diminished shallow breath sounds diffusely  Abdominal: Soft. Bowel sounds are normal. He exhibits distension. There is no tenderness.  Musculoskeletal: Normal range of motion. He exhibits no edema or tenderness.  Neurological: Coordination  normal.  Arousable to tactile stimulus by opening his eyes. Able to squeeze my hand on exam. Moves all extremities. Nonverbal.  Skin:  Diffusely mottled with central peripheral cyanosis  Psychiatric: He has a normal mood and affect.  Nursing note and vitals reviewed.   ED Course  Procedures (including critical care time) Labs Review Labs Reviewed  I-STAT CG4 LACTIC ACID, ED - Abnormal; Notable for the following:    Lactic Acid, Venous 6.65 (*)    All other components within normal limits  CULTURE, BLOOD (ROUTINE X 2)  CULTURE, BLOOD (ROUTINE X 2)  URINE CULTURE  COMPREHENSIVE METABOLIC PANEL  CBC WITH DIFFERENTIAL/PLATELET  URINALYSIS, ROUTINE W REFLEX MICROSCOPIC (NOT AT [facility])    Imaging Review No results found.   EKG Interpretation   Date/Time:  Wednesday April 19 2015 13:26:34 EDT Ventricular Rate:  139 PR Interval:    QRS Duration: 79 QT Interval:  339 QTC Calculation: 515 R Axis:   -47 Text Interpretation:  Left anterior fascicular block Anterior infarct, old  ST elevation, consider inferior injury Prolonged QT interval Confirmed by  Winfred Leeds  MD, Merrin Mcvicker [phone]) on 05/17/2015 2:50:48 PM     Patient intubated by me successfully at 1340 5 PM he was premedicated with succinylcholine 120 mg IV and etomidate 25 Mill grams IV. Respirations assisted with bag valve mask. Intubated with #7.5 ET tube with good visualization of the cords and symmetric bilateral breast sounds post intubation with good color change change on  colomerer. Patient placed on ventilator Chest x-ray viewed by me. ET tube with good placement  Patient treated with intravenous saline bolus 30 mg liters per kilogram. Vancomycin and Zosyn intravenously ordered. Results for orders placed or performed during the hospital encounter of 04/18/2015  Comprehensive metabolic panel  Result Value Ref Range   Sodium 136 135 - 145 mmol/L   Potassium 3.3 (L) 3.5 - 5.1 mmol/L   Chloride 94 (L) 101 - 111 mmol/L   CO2 24  22 - 32 mmol/L   Glucose, Bld 187 (H) 65 - 99 mg/dL   BUN 51 (H) 6 - 20 mg/dL   Creatinine, Ser 2.71 (H) 0.61 - 1.24 mg/dL   Calcium 8.3 (L) 8.9 - 10.3 mg/dL   Total Protein 7.2 6.5 - 8.1 g/dL   Albumin 3.6 3.5 - 5.0 g/dL   AST 43 (H) 15 - 41 U/L   ALT 32 17 - 63 U/L   Alkaline Phosphatase 66 38 - 126 U/L   Total Bilirubin 1.8 (H) 0.3 - 1.2 mg/dL   GFR calc non Af Amer 21 (L) >60 mL/min   GFR calc Af Amer 25 (L) >60 mL/min   Anion gap 18 (H) 5 - 15  I-Stat CG4 Lactic Acid, ED  (not at  [facility])  Result Value Ref Range   Lactic Acid, Venous 6.65 (HH) 0.5 - 2.0 mmol/L   Comment NOTIFIED PHYSICIAN   POC occult blood, ED  Result Value Ref Range  Fecal Occult Bld POSITIVE (A) NEGATIVE   Mr Kizzie Fantasia Contrast  04/17/2015   CLINICAL DATA:  74 year old male with Brain metastases. Stage IV (T3, N2, M1 B) non-small cell lung cancer, adenocarcinoma diagnosed in July 2016 presented with multiple right lung lesion in addition to mediastinal lymphadenopathy and metastatic disease to the liver and bones.  Study for stereotactic radiosurgery targeting. Subsequent encounter.  EXAM: MRI HEAD WITHOUT AND WITH CONTRAST  TECHNIQUE: Multiplanar, multiecho pulse sequences of the brain and surrounding structures were obtained without and with intravenous contrast.  CONTRAST:  77m MULTIHANCE GADOBENATE DIMEGLUMINE 529 MG/ML IV SOLN  COMPARISON:  Brain MRI 03/24/2015  FINDINGS: Motion degraded post-contrast imaging despite repeated imaging attempts.  A round 8-9 mm enhancing mass of the left superior cerebellum adjacent to the tentorium is re- identified and does appear mildly larger on coronal post-contrast images (series 14, image 17). Coronal T2 weighted images raise the possibility that this is extra-axial, however, there does appear to be mild associated cerebellar folia swelling (series 7, image 30), and the other sequences favor metastasis.  The previously question metastasis in the posterior superior left frontal  lobe is visible only as a 6 to 7 mm area of abnormal diffusion on series 9, image 45, and vague abnormal enhancement on series 14, image 16. T2 hyperintensity, but no associated edema or mass effect.  The medial parietal lesion identified previously on series 12, image 3 of the prior study is not as well visualized today (seen only on series 14, image 8). This might be vascular in nature rather than pathologic enhancement.  No other convincing enhancing brain lesion is evident today.  No intracranial mass effect. No acute intracranial hemorrhage identified. No diffusion abnormality suggestive of acute infarct. Major intracranial vascular flow voids are stable. Stable ventricle size and configuration. Scattered nonspecific nonenhancing cerebral white matter lesions are stable. Negative pituitary and cervicomedullary junction. Grossly negative visualized cervical spinal cord. Visible bone marrow signal is within normal limits.  Visible internal auditory structures appear normal. Visualized paranasal sinuses are clear. Small right nasopharyngeal retention cyst appears stable. Mild left mastoid fluid is stable. Stable orbit and scalp soft tissues.  IMPRESSION: 1. Motion degraded post-contrast imaging despite repeated imaging attempts. Considering the mild motion artifact also on the 03/24/2015 study, [facility]. 2. Two small lesions most compatible with subcentimeter brain metastases: - Left superior cerebellum (small meningioma was alternatively considered, but felt less likely) - Posterior left superior frontal gyrus (only visible on diffusion series 9 and coronal post-contrast series 14). 3. No other convincing brain metastasis today.   Electronically Signed   By: HGenevie AnnM.D.   On: 04/17/2015 13:52   Mr BJeri CosWNAContrast  03/24/2015   CLINICAL DATA:  Lung mass. Melanoma. Metastatic disease on PET scan.  EXAM: MRI  HEAD WITHOUT AND WITH CONTRAST  TECHNIQUE: Multiplanar, multiecho pulse sequences of the brain and surrounding structures were obtained without and with intravenous contrast.  CONTRAST:  122mMULTIHANCE GADOBENATE DIMEGLUMINE 529 MG/ML IV SOLN  COMPARISON:  None.  FINDINGS: Mild atrophy.  Negative for hydrocephalus.  Negative for acute infarct. Mild chronic microvascular ischemic change in the white matter. Negative for intracranial hemorrhage  7 mm enhancing mass in the left superior cerebellum just below the tentorium compatible with metastatic disease.  6 mm enhancing nodule in the left medial parietal lobe. No significant surrounding edema.  Possible third  5 mm lesion in the left occipital parietal lobe medially.  IMPRESSION: 7 mm enhancing mass left superior cerebellum. 6 mm enhancing nodule left medial parietal lobe. No significant edema or shift. Possible third lesion left medial parietal lobe.  These results were called by telephone at the time of interpretation on 03/24/2015 at 7:54 pm to Dr. Lindi Adie, who verbally acknowledged these results.   Electronically Signed   By: Franchot Gallo M.D.   On: 03/24/2015 19:56   Nm Pet Image Initial (pi) Skull Base To Thigh  03/21/2015   CLINICAL DATA:  Initial treatment strategy for right lung mass. History of melanoma.  EXAM: NUCLEAR MEDICINE PET WHOLE BODY  TECHNIQUE: 8.7 mCi F-18 FDG was injected intravenously. Full-ring PET imaging was performed from the vertex to the feet after the radiotracer. CT data was obtained and used for attenuation correction and anatomic localization.  FASTING BLOOD GLUCOSE:  Value: 88 mg/dl  COMPARISON:  Limited correlation is made with the reformatted images from an outside chest CT performed 02/24/2015. The axial images from that study and the report are not available.  FINDINGS: NECK  No hypermetabolic cervical lymph nodes are identified.There are no lesions of the pharyngeal mucosal space. No obvious intracranial lesions are  identified. However, there are small hypermetabolic nodules within the scalp which are suspicious for metastatic disease.  CHEST  There is extensive hypermetabolic tumor throughout the right hemithorax. There is a large right retro hilar mass with subcarinal extension, measuring up to 6.5 x 4.0 cm on image 98. This may invade the esophagus. This lesion is hypermetabolic with an SUV max of 15.5. In addition, there are several smaller hypermetabolic right paratracheal and right hilar lymph nodes appear There is nodular hypermetabolic activity associated with a mildly complex partially loculated right pleural effusion. There are several hypermetabolic right-sided pulmonary nodules, including a subpleural right upper lobe lesion measuring 1.7 cm on image 24 and a 1.5 cm lesion in the right lower lobe on image 36. There are no suspicious left-sided pulmonary nodules.  ABDOMEN/PELVIS  There are multiple hypermetabolic liver lesions consistent with metastatic disease. The lesions are not well seen on the noncontrast images, in part secondary to extensive hepatic steatosis. A lesion in the medial segment of the left lobe has an SUV max of 12.1. There is a hypermetabolic right adrenal nodule measuring 2.6 x 1.8 cm on image 137 (SUV max 7.6). The left adrenal gland appears normal. There is no abnormal activity within the spleen or pancreas. There are small hypermetabolic lymph nodes within the porta hepatis, upper retroperitoneum and retrocrural space.  SKELETON  There is multifocal hypermetabolic osseous metastatic disease. There is prominent involvement within the sternal manubrium (SUV max 10.6) and within the T10 vertebral body (SUV max 8.6). No definite pathologic fractures identified.  Extremities: There is a hypermetabolic mass within the medial left calf. This involves the gastrocnemius musculature and has an SUV max of 17.9. Small hypermetabolic soft tissue nodules are present within the proximal thigh musculature as  well as in the erector spinae musculature. No definite abnormal activity seen within the upper extremities.  IMPRESSION: 1. Extensive metastatic disease with dominant right retro hilar mass which may reflect primary bronchogenic carcinoma. Metastases are present within multiple lymph nodes, the liver, the right adrenal gland, the bones and soft tissues. This distribution can be seen with metastatic melanoma. 2. Metastatic disease is also present within the right lung and pleural space. 3. Prominent hypermetabolic soft tissue mass within the left calf.  Electronically Signed   By: Richardean Sale M.D.   On: 03/21/2015 15:51   US Biopsy  03/29/2015   INDICATION: History of melanoma and long history of smoking, now with multiple hypermetabolic liver lesions worrisome for metastatic disease. Please perform ultrasound-guided liver lesion biopsy for tissue diagnostic purposes  EXAM: ULTRASOUND GUIDED LIVER LESION BIOPSY  COMPARISON:  PET-CT - 03/21/2015  MEDICATIONS: Fentanyl 25 mcg IV; Versed 1 mg IV  ANESTHESIA/SEDATION: Total Moderate Sedation time  14 minutes  COMPLICATIONS: None immediate  PROCEDURE: Informed written consent was obtained from the patient after a discussion of the risks, benefits and alternatives to treatment. The patient understands and consents the procedure. A timeout was performed prior to the initiation of the procedure.  Ultrasound scanning was performed of the right upper abdominal quadrant demonstrates multiple well-defined hypoechoic masses correlating with the hypermetabolic lesion seen on preceding PET-CT. The dominant approximately 1.4 x 1.4 cm hypoechoic well-defined lesion within the subcapsular aspect of the medial segment of the left lobe of the liver adjacent to the gallbladder fossa (image 18) was targeted for biopsy due to lesion location and sonographic window. The procedure was planned.  The right upper abdominal quadrant was prepped and draped in the usual sterile fashion.  The overlying soft tissues were anesthetized with 1% lidocaine with epinephrine. A 17 gauge, 6.8 cm co-axial needle was advanced into a peripheral aspect of the lesion. This was followed by 4 core biopsies with an 18 gauge core device under direct ultrasound guidance.  The coaxial needle track was embolized with a small amount of Gel-Foam slurry. Superficial hemostasis was achieved with manual compression. Post procedural scanning was negative for definitive area of hemorrhage or additional complication. A dressing was placed. The patient tolerated the procedure well without immediate post procedural complication.  IMPRESSION: Technically successful ultrasound guided core needle biopsy of dominant well-defined approximately 1.4 cm hypoechoic lesion within the subcapsular aspect of the medial segment of left lobe of the liver.   Electronically Signed   By: Sandi Mariscal M.D.   On: 03/29/2015 17:02   Dg Abd Portable 1v  04/27/2015   CLINICAL DATA:  Orogastric tube placement  EXAM: PORTABLE ABDOMEN - 1 VIEW  COMPARISON:  None.  FINDINGS: Orogastric tube not seen. Stomach distended with air. No small or large bowel dilatation. No free air. Lung bases clear.  IMPRESSION: Orogastric tube not seen in the abdominal region. Stomach distended with fluid and air. Bowel gas pattern otherwise unremarkable. Lung bases clear.   Electronically Signed   By: Lowella Grip III M.D.   On: 04/28/2015 14:47    MDM  Code sepsis called. Consult with intensive care unit team who will arrange for admission to intensive care unit. I had an extensive discussion with patient's son who states that patient has living will but the family has not formally decided on life support measures. He requests the patient be intubated and on life support until he can consult with other family members. Further lab work to be checked by ICU staff Final diagnoses:  Sepsis   Pt to be admitted to ICU Dx#1 septic shock #2hypokalemia CRITICAL  CARE Performed by: Orlie Dakin Total critical care time: 50 minute Critical care time was exclusive of separately billable procedures and treating other patients. Critical care was necessary to treat or prevent imminent or life-threatening deterioration. Critical care was time spent personally by me on the following activities: development of treatment plan with patient and/or surrogate as well as nursing, discussions with consultants,  evaluation of patient's response to treatment, examination of patient, obtaining history from patient or surrogate, ordering and performing treatments and interventions, ordering and review of laboratory studies, ordering and review of radiographic studies, pulse oximetry and re-evaluation of patient's condition.     Orlie Dakin, MD 05/04/2015 [phone]

## 2015-04-20 ENCOUNTER — Inpatient Hospital Stay (HOSPITAL_COMMUNITY): Payer: Medicare Other

## 2015-04-20 ENCOUNTER — Ambulatory Visit: Admission: RE | Admit: 2015-04-20 | Payer: Medicare Other | Source: Ambulatory Visit | Admitting: Radiation Oncology

## 2015-04-20 ENCOUNTER — Encounter (HOSPITAL_COMMUNITY): Payer: Self-pay

## 2015-04-20 ENCOUNTER — Ambulatory Visit: Payer: Medicare Other | Admitting: Nurse Practitioner

## 2015-04-20 ENCOUNTER — Other Ambulatory Visit: Payer: Medicare Other

## 2015-04-20 DIAGNOSIS — C7951 Secondary malignant neoplasm of bone: Secondary | ICD-10-CM

## 2015-04-20 DIAGNOSIS — N179 Acute kidney failure, unspecified: Secondary | ICD-10-CM | POA: Insufficient documentation

## 2015-04-20 DIAGNOSIS — C3490 Malignant neoplasm of unspecified part of unspecified bronchus or lung: Secondary | ICD-10-CM

## 2015-04-20 DIAGNOSIS — C787 Secondary malignant neoplasm of liver and intrahepatic bile duct: Secondary | ICD-10-CM

## 2015-04-20 DIAGNOSIS — R5081 Fever presenting with conditions classified elsewhere: Secondary | ICD-10-CM

## 2015-04-20 DIAGNOSIS — J9600 Acute respiratory failure, unspecified whether with hypoxia or hypercapnia: Secondary | ICD-10-CM | POA: Insufficient documentation

## 2015-04-20 DIAGNOSIS — R6521 Severe sepsis with septic shock: Secondary | ICD-10-CM

## 2015-04-20 DIAGNOSIS — D701 Agranulocytosis secondary to cancer chemotherapy: Secondary | ICD-10-CM

## 2015-04-20 LAB — BASIC METABOLIC PANEL
ANION GAP: 20 — AB (ref 5–15)
Anion gap: 11 (ref 5–15)
BUN: 48 mg/dL — ABNORMAL HIGH (ref 6–20)
BUN: 50 mg/dL — ABNORMAL HIGH (ref 6–20)
CHLORIDE: 97 mmol/L — AB (ref 101–111)
CO2: 17 mmol/L — AB (ref 22–32)
CO2: 10 mmol/L — ABNORMAL LOW (ref 22–32)
CREATININE: 3.97 mg/dL — AB (ref 0.61–1.24)
Calcium: 6.3 mg/dL — CL (ref 8.9–10.3)
Calcium: 6.1 mg/dL — CL (ref 8.9–10.3)
Chloride: 102 mmol/L (ref 101–111)
Creatinine, Ser: 2.8 mg/dL — ABNORMAL HIGH (ref 0.61–1.24)
GFR, EST AFRICAN AMERICAN: 24 mL/min — AB (ref >60)
GFR, EST AFRICAN AMERICAN: 16 mL/min — AB (ref >60)
GFR, EST NON AFRICAN AMERICAN: 20 mL/min — AB (ref >60)
GFR, EST NON AFRICAN AMERICAN: 13 mL/min — AB (ref >60)
GLUCOSE: 204 mg/dL — AB (ref 65–99)
Glucose, Bld: 243 mg/dL — ABNORMAL HIGH (ref 65–99)
Potassium: 3.1 mmol/L — ABNORMAL LOW (ref 3.5–5.1)
Potassium: 5.9 mmol/L — ABNORMAL HIGH (ref 3.5–5.1)
SODIUM: 130 mmol/L — AB (ref 135–145)
Sodium: 127 mmol/L — ABNORMAL LOW (ref 135–145)

## 2015-04-20 LAB — BLOOD GAS, ARTERIAL
Acid-base deficit: 8.2 mmol/L — ABNORMAL HIGH (ref 0.0–2.0)
Bicarbonate: 17.1 mEq/L — ABNORMAL LOW (ref 20.0–24.0)
DRAWN BY: 232811
FIO2: 0.3
LHR: 14 {breaths}/min
MECHVT: 580 mL
O2 SAT: 96.9 %
PATIENT TEMPERATURE: 39.7
PEEP: 5 cmH2O
TCO2: 15.5 mmol/L (ref 0–100)
pCO2 arterial: 41.4 mmHg (ref 35.0–45.0)
pH, Arterial: 7.257 — ABNORMAL LOW (ref 7.350–7.450)
pO2, Arterial: 118 mmHg — ABNORMAL HIGH (ref 80.0–100.0)

## 2015-04-20 LAB — CBC
HCT: 40.6 % (ref 39.0–52.0)
Hemoglobin: 13.6 g/dL (ref 13.0–17.0)
MCH: 30.6 pg (ref 26.0–34.0)
MCHC: 33.5 g/dL (ref 30.0–36.0)
MCV: 91.4 fL (ref 78.0–100.0)
PLATELETS: 91 10*3/uL — AB (ref 150–400)
RBC: 4.44 MIL/uL (ref 4.22–5.81)
RDW: 12.9 % (ref 11.5–15.5)
WBC: 0.6 10*3/uL — CL (ref 4.0–10.5)

## 2015-04-20 LAB — GLUCOSE, CAPILLARY
GLUCOSE-CAPILLARY: 202 mg/dL — AB (ref 65–99)
GLUCOSE-CAPILLARY: 133 mg/dL — AB (ref 65–99)
GLUCOSE-CAPILLARY: 206 mg/dL — AB (ref 65–99)
GLUCOSE-CAPILLARY: 162 mg/dL — AB (ref 65–99)
Glucose-Capillary: 167 mg/dL — ABNORMAL HIGH (ref 65–99)
Glucose-Capillary: 167 mg/dL — ABNORMAL HIGH (ref 65–99)
Glucose-Capillary: 193 mg/dL — ABNORMAL HIGH (ref 65–99)

## 2015-04-20 LAB — URINE CULTURE: Culture: NO GROWTH

## 2015-04-20 LAB — LACTIC ACID, PLASMA: Lactic Acid, Venous: 3.2 mmol/L (ref 0.5–2.0)

## 2015-04-20 LAB — PHOSPHORUS: PHOSPHORUS: 2.3 mg/dL — AB (ref 2.5–4.6)

## 2015-04-20 LAB — PROCALCITONIN: PROCALCITONIN: 66.17 ng/mL

## 2015-04-20 LAB — MAGNESIUM: MAGNESIUM: 1.6 mg/dL — AB (ref 1.7–2.4)

## 2015-04-20 MED ORDER — HEPARIN SODIUM (PORCINE) 5000 UNIT/ML IJ SOLN
5000.0000 [IU] | Freq: Three times a day (TID) | INTRAMUSCULAR | Status: DC
  Administered 2015-04-20: 5000 [IU] via SUBCUTANEOUS
  Filled 2015-04-20: qty 1

## 2015-04-20 MED ORDER — SODIUM CHLORIDE 0.9 % IV SOLN
10.0000 mg/h | INTRAVENOUS | Status: DC
  Filled 2015-04-20: qty 10

## 2015-04-20 MED ORDER — MIDAZOLAM HCL 2 MG/2ML IJ SOLN
1.0000 mg | INTRAMUSCULAR | Status: DC | PRN
  Filled 2015-04-20: qty 2

## 2015-04-20 MED ORDER — ATROPINE SULFATE 1 % OP SOLN
4.0000 [drp] | OPHTHALMIC | Status: DC | PRN
  Filled 2015-04-20: qty 2

## 2015-04-20 MED ORDER — SODIUM CHLORIDE 0.9 % IV BOLUS (SEPSIS)
500.0000 mL | Freq: Once | INTRAVENOUS | Status: AC
  Administered 2015-04-20: 500 mL via INTRAVENOUS

## 2015-04-20 MED ORDER — MIDAZOLAM HCL 2 MG/2ML IJ SOLN
1.0000 mg | INTRAMUSCULAR | Status: DC | PRN
  Administered 2015-04-20: 1 mg via INTRAVENOUS
  Filled 2015-04-20 (×3): qty 2

## 2015-04-20 MED ORDER — SODIUM BICARBONATE 8.4 % IV SOLN
INTRAVENOUS | Status: DC
  Administered 2015-04-20: 09:00:00 via INTRAVENOUS
  Filled 2015-04-20 (×4): qty 150

## 2015-04-20 MED ORDER — SODIUM CHLORIDE 0.9 % IV SOLN
1.0000 g | Freq: Once | INTRAVENOUS | Status: AC
  Administered 2015-04-20: 1 g via INTRAVENOUS
  Filled 2015-04-20 (×2): qty 10

## 2015-04-20 MED ORDER — MIDAZOLAM HCL 2 MG/2ML IJ SOLN
2.0000 mg | Freq: Once | INTRAMUSCULAR | Status: AC
  Administered 2015-04-20: 2 mg via INTRAVENOUS

## 2015-04-20 MED ORDER — ACETAMINOPHEN 650 MG RE SUPP: 650.0000 mg | RECTAL | Status: DC | PRN

## 2015-04-20 MED ORDER — NOREPINEPHRINE BITARTRATE 1 MG/ML IV SOLN
5.0000 ug/min | INTRAVENOUS | Status: DC
  Administered 2015-04-20: 50 ug/min via INTRAVENOUS
  Administered 2015-04-20: 30 ug/min via INTRAVENOUS
  Administered 2015-04-20: 15 ug/min via INTRAVENOUS
  Filled 2015-04-20 (×2): qty 16

## 2015-04-20 MED ORDER — MAGNESIUM SULFATE IN D5W 10-5 MG/ML-% IV SOLN
1.0000 g | Freq: Once | INTRAVENOUS | Status: AC
  Administered 2015-04-20: 1 g via INTRAVENOUS
  Filled 2015-04-20: qty 100

## 2015-04-20 MED ORDER — POTASSIUM CHLORIDE 10 MEQ/50ML IV SOLN
10.0000 meq | INTRAVENOUS | Status: AC
  Administered 2015-04-20 (×4): 10 meq via INTRAVENOUS
  Filled 2015-04-20 (×4): qty 50

## 2015-04-20 MED ORDER — TBO-FILGRASTIM 300 MCG/0.5ML ~~LOC~~ SOSY
300.0000 ug | PREFILLED_SYRINGE | Freq: Every morning | SUBCUTANEOUS | Status: DC
  Administered 2015-04-20: 300 ug via SUBCUTANEOUS
  Filled 2015-04-20: qty 0.5

## 2015-04-20 MED ORDER — FENTANYL BOLUS VIA INFUSION
50.0000 ug | INTRAVENOUS | Status: DC | PRN
  Filled 2015-04-20: qty 200

## 2015-04-20 MED ORDER — MIDAZOLAM BOLUS VIA INFUSION
5.0000 mg | INTRAVENOUS | Status: DC | PRN
  Filled 2015-04-20: qty 20

## 2015-04-20 MED ORDER — FENTANYL CITRATE (PF) 2500 MCG/50ML IJ SOLN
100.0000 ug/h | INTRAMUSCULAR | Status: DC
  Administered 2015-04-20: 100 ug/h via INTRAVENOUS
  Filled 2015-04-20 (×2): qty 50

## 2015-04-20 MED ORDER — POTASSIUM PHOSPHATES 15 MMOLE/5ML IV SOLN
20.0000 meq | Freq: Once | INTRAVENOUS | Status: AC
  Administered 2015-04-20: 20 meq via INTRAVENOUS
  Filled 2015-04-20: qty 4.55

## 2015-04-21 LAB — CULTURE, RESPIRATORY

## 2015-04-21 LAB — CULTURE, RESPIRATORY W GRAM STAIN

## 2015-04-24 LAB — CULTURE, BLOOD (ROUTINE X 2)
CULTURE: NO GROWTH
Culture: NO GROWTH

## 2015-04-25 ENCOUNTER — Telehealth: Payer: Self-pay

## 2015-04-25 NOTE — Telephone Encounter (Signed)
On 04/25/2015 I received a death certificate from [employer] and [employer]. The death certificate is for cremation. The patient is a patient of Doctor Elsworth Soho. The death certificate will be taken to Zacarias Pontes (2S) for signature today. On 04/25/2015 I received a faxed copy from [employer]. The death certificate will be taken to Zacarias Pontes (2S) this pm for signature. On 05/05/15 I received the death certificate back from Doctor Elsworth Soho. I got the death certificate ready for pickup and called the funeral home to let them know the death certificate was ready for pickup.

## 2015-04-27 ENCOUNTER — Other Ambulatory Visit: Payer: Medicare Other

## 2015-05-02 ENCOUNTER — Encounter (HOSPITAL_COMMUNITY): Admission: RE | Payer: Self-pay | Source: Ambulatory Visit

## 2015-05-02 ENCOUNTER — Ambulatory Visit (HOSPITAL_COMMUNITY): Admission: RE | Admit: 2015-05-02 | Payer: Medicare Other | Source: Ambulatory Visit

## 2015-05-02 ENCOUNTER — Ambulatory Visit (HOSPITAL_COMMUNITY): Payer: Medicare Other

## 2015-05-02 SURGERY — RADIOLOGY WITH ANESTHESIA
Anesthesia: General

## 2015-05-04 ENCOUNTER — Other Ambulatory Visit: Payer: Medicare Other

## 2015-05-04 ENCOUNTER — Encounter: Payer: Medicare Other | Admitting: Nutrition

## 2015-05-04 ENCOUNTER — Ambulatory Visit: Payer: Medicare Other

## 2015-05-08 ENCOUNTER — Ambulatory Visit: Payer: Medicare Other | Admitting: Radiation Oncology

## 2015-05-11 ENCOUNTER — Other Ambulatory Visit: Payer: Medicare Other

## 2015-05-18 NOTE — Progress Notes (Addendum)
[facility] Progress Note Patient Name: Todd Poole DOB: [birth year] MRN: [id number]   Date of Service  Apr 27, 2015  HPI/Events of Note  Increasing pressor requirement and agitation with increasing Temp  eICU Interventions  Cool packs 500 ns bolus Dec fentanyl PRN versed     Intervention Category Major Interventions: Hypotension - evaluation and management  Dulcie Gammon 04/27/15, 1:22 AM

## 2015-05-18 NOTE — Progress Notes (Signed)
CRITICAL VALUE ALERT  Critical value received: Calcium 6.1  Date of notification:  May 12, 2015  Time of notification: 1703   Critical value read back:Yes.    Nurse who received alert:  Javier Glazier, RN  MD notified (1st page):  Governor Rooks  Time of first page:  1703  MD notified (2nd page):  Time of second page:  Responding MD: Governor Rooks  Time MD responded:  8208  Pt is working towards comfort care. No plans to replete currently. MD aware of value.

## 2015-05-18 NOTE — Progress Notes (Signed)
Patient expired at 2056, absence of respiration and heart sound confirmed by two registered nurses Loyal Gambler Verdun Rackley and Kalman Jewels) by auscultating for heart rate and respirations for sixty seconds. Family present at bedside during this time, emotional support provided as appropriate.

## 2015-05-18 NOTE — Progress Notes (Signed)
CRITICAL VALUE ALERT  Critical value received:  Calcium 6.3  Date of notification:  04/21/15  Time of notification:  3338  Critical value read back:Yes.    Nurse who received alert:  Fredia Beets  MD notified (1st page):  Dr. Stevenson Clinch ( [facility] physician)  Time of first page:  [phone]  Responding MD:  Dr.Mungal  Time MD responded:  [phone]

## 2015-05-18 NOTE — Progress Notes (Signed)
[facility] Progress Note Patient Name: Todd Poole DOB: August 19, 1941 MRN: [id number]   Date of Service  May 03, 2015  HPI/Events of Note  Spoke with pt's POA over phone.  Family in agreement to transition to comfort measures.   eICU Interventions  Order set for withdrawal of life sustaining measures entered.      Intervention Category Major Interventions: Other:  Tytianna Greenley 2015-05-03, 8:14 PM

## 2015-05-18 NOTE — Progress Notes (Signed)
[facility] Progress Note Patient Name: Todd Poole DOB: December 08, 1940 MRN: [id number]   Date of Service  2015/05/11  HPI/Events of Note  Low Mg, Ca, Phos, K  eICU Interventions  Lytes replaced.      Intervention Category Major Interventions: Electrolyte abnormality - evaluation and management  Tenzin Pavon 11-May-2015, 5:42 AM

## 2015-05-18 NOTE — Progress Notes (Signed)
Patient extubated per physician's order to withdraw care.

## 2015-05-18 NOTE — Progress Notes (Signed)
CRITICAL VALUE ALERT  Critical value received:  Lactic Acid 3.22  Date of notification:  2015/05/20  Time of notification:  3893  Critical value read back:Yes.    Nurse who received alert:  Toy Baker, RN   MD notified (1st page):  Georgann Housekeeper, NP  Time of first page:  0100; notified in person.   Responding MD:  Georgann Housekeeper, NP  Time MD responded:  0100

## 2015-05-18 NOTE — Progress Notes (Signed)
Chaplain paged at 2106 after Todd Poole had died. Chaplain met with family and provided spiritual and emotional comfort in the midst of their grieving the death of their loved one. The family had come from [place] and [place] expecting a longer stay with him before his death. Each was grateful for the short time they had with him.  Because Todd Poole was insistent that no "fuss" be made over his death, and his desire to be cremated without a funeral or burial, the family used the time with the chaplain to speak of spiritual matters and ask spiritual question.  The family wished to express their great appreciation for the loving care provided by the staff of [facility].   Sallee Lange. Chisom Muntean, DMin, MDiv, MA Chaplain

## 2015-05-18 NOTE — Progress Notes (Signed)
Initial Nutrition Assessment  DOCUMENTATION CODES:   Severe malnutrition in context of chronic illness  INTERVENTION:   Based on [place] decision, if pt to remain intubated recommend starting enteral nutrition.   TF recommendation if needed: Initiate Vital AF 1.2 @ 20 ml/hr via OGT and increase by 10 ml every 8 hours to goal rate of 50 ml/hr.   30 ml Prostat BID.    Tube feeding regimen provides 1640 kcal (94% of needs), 120 grams of protein, and 973 ml of H2O.   Monitor magnesium, potassium, and phosphorus daily for at least 3 days, MD to replete as needed, as pt is at risk for refeeding syndrome given severe malnutrition status.  RD to continue to monitor for plan    NUTRITION DIAGNOSIS:   Inadequate oral intake related to inability to eat as evidenced by NPO status.  GOAL:   Patient will meet greater than or equal to 90% of their needs  MONITOR:   Vent status, Labs, Weight trends, Skin, I & O's, Other (Comment) (GOC)  REASON FOR ASSESSMENT:   Consult Enteral/tube feeding initiation and management  ASSESSMENT:   74 year old male w/ stage IV lung cancer (last chem 7/28). Admitted on 8/3 w/ delirium, agitation, temp >104 and hypoxic w/ sats <80.   Per critical care note, pt's family leaning towards comfort care for pt. During visit, RD noticed bereavement cart outside of room and TF was off. Unsure what plan is with patient. Recommendations provided above.  Patient is currently intubated on ventilator support MV: 9.4 L/min Temp (24hrs), Avg:102.1 F (38.9 C), Min:98.8 F (37.1 C), Max:104.5 F (40.3 C)  Propofol: none  Per weight history documentation, pt has lost 12 lb since 7/8 (6% weight loss x 1 month, significant for time frame).  Nutrition-Focused physical exam completed. Findings are severe fat depletion, severe muscle depletion, and mild edema.   Labs reviewed: CBGs: 133-193 Low Na, K, Mg, Phos Elevated BUN & Creatinine  Diet Order:  Diet NPO time  specified  Skin:  Reviewed, no issues  Last BM:  PTA  Height:   Ht Readings from Last 1 Encounters:  05/05/2015 '5\' 10"'$  (1.778 m)    Weight:   Wt Readings from Last 1 Encounters:  May 01, 2015 181 lb 3.5 oz (82.2 kg)    Ideal Body Weight:  75.5 kg  BMI:  Body mass index is 26 kg/(m^2).  Estimated Nutritional Needs:   Kcal:  1750  Protein:  115-125g  Fluid:  1.8L/day  EDUCATION NEEDS:   No education needs identified at this time  Clayton Bibles, MS, RD, LDN Pager: [phone] After Hours Pager: [phone]

## 2015-05-18 NOTE — Procedures (Signed)
Central Venous Catheter Insertion Procedure Note Todd Poole [id number] 01-Jun-1941  Procedure: Insertion of Central Venous Catheter Indications: Assessment of intravascular volume, Drug and/or fluid administration and Frequent blood sampling  Procedure Details Consent: Risks of procedure as well as the alternatives and risks of each were explained to the (patient/caregiver).  Consent for procedure obtained. Time Out: Verified patient identification, verified procedure, site/side was marked, verified correct patient position, special equipment/implants available, medications/allergies/relevent history reviewed, required imaging and test results available.  Performed  Maximum sterile technique was used including antiseptics, cap, gloves, gown, hand hygiene, mask and sheet. Skin prep: Chlorhexidine; local anesthetic administered A antimicrobial bonded/coated triple lumen catheter was placed in the left internal jugular vein using the Seldinger technique. Ultrasound guidance used.Yes.   Catheter placed to 20 cm. Blood aspirated via all 3 ports and then flushed x 3. Line sutured x 2 and dressing applied.  Evaluation Blood flow good Complications: No apparent complications Patient did tolerate procedure well. Chest X-ray ordered to verify placement.  CXR: pending.  Georgann Housekeeper, AGACNP-BC [facility] Pulmonology/Critical Care Pager [phone] or [phone]  May 09, 2015 1:59 AM

## 2015-05-18 NOTE — Progress Notes (Signed)
DIAGNOSIS: Stage IV (T3, N2, M1 B) non-small cell lung cancer, adenocarcinoma diagnosed in July 2016 presented with multiple right lung lesion in addition to mediastinal lymphadenopathy and metastatic disease to the liver and bones.  PRIOR THERAPY: None  CURRENT THERAPY: Systemic chemotherapy with carboplatin for AUC of 5 and paclitaxel 175 MG/M2 every 3 weeks. First dose 04/13/2015.  Subjective: The patient is seen and examined today. I also spoke to his son Ronalee Belts. He was admitted to [facility] intensive care unit yesterday with septic shock. The patient received the first cycle of systemic chemotherapy last week with carboplatin and paclitaxel. He was not feeling well for the last few days and complaining of increasing fatigue. He presented yesterday to the [place] for routine evaluation and repeat blood work that was found to have fever up to 104.7. He was transferred immediately to the emergency department and was intubated for airway protection because of the severe sepsis. He is currently sedated and intubated. He continues to have fever.   Objective: Vital signs in last 24 hours: Temp:  [100.4 F (38 C)-104.7 F (40.4 C)] 102.2 F (39 C) (08/04 0700) Pulse Rate:  [94-138] 110 (08/04 0800) Resp:  [14-22] 14 (08/04 0800) BP: (82-151)/(36-79) 151/59 mmHg (08/04 0348) SpO2:  [80 %-100 %] 98 % (08/04 0800) Arterial Line BP: (69-146)/(27-62) 130/57 mmHg (08/04 0800) FiO2 (%):  [30 %-100 %] 30 % (08/04 0804) Weight:  [174 lb (78.926 kg)-181 lb 3.5 oz (82.2 kg)] 181 lb 3.5 oz (82.2 kg) (08/04 0500)  Intake/Output from previous day: 08/03 0701 - 08/04 0700 In: 1839.7 [I.V.:1488.7; NG/GT:101; IV Piggyback:250] Out: 450 [Urine:450] Intake/Output this shift:    General appearance: Sedated and intubated Resp: rhonchi bilaterally Cardio: regular rate and rhythm, S1, S2 normal, no murmur, click, rub or gallop GI: soft, non-tender; bowel sounds normal; no masses,  no  organomegaly Extremities: extremities normal, atraumatic, no cyanosis or edema  Lab Results:   Recent Labs  05/02/2015 1340 05/09/15 0400  WBC 0.7* 0.6*  HGB 15.6 13.6  HCT 46.1 40.6  PLT 102* 91*   BMET  Recent Labs  04/17/2015 1340 May 09, 2015 0400  NA 136 130*  K 3.3* 3.1*  CL 94* 102  CO2 24 17*  GLUCOSE 187* 204*  BUN 51* 48*  CREATININE 2.71* 2.80*  CALCIUM 8.3* 6.3*    Studies/Results: Dg Abd 1 View  04/24/2015   CLINICAL DATA:  Orogastric tube placement.  EXAM: ABDOMEN - 1 VIEW  COMPARISON:  05/07/2015 at 14:11 p.m.  FINDINGS: Examination demonstrates an enteric tube looped once over the stomach with tip just right of midline likely over the distal stomach. Bowel gas pattern is nonobstructive. Remainder of the exam is unchanged.  IMPRESSION: No acute findings.  Enteric tube with tip just right of midline likely over the distal stomach.   Electronically Signed   By: Marin Olp M.D.   On: 04/28/2015 17:29   Dg Chest Port 1 View  05-09-15   CLINICAL DATA:  Central line placement  EXAM: PORTABLE CHEST - 1 VIEW  COMPARISON:  04/27/2015  FINDINGS: Endotracheal tube is 5.5 cm above the carina. There is a new left jugular central line with tip in the expected location of the SVC at the azygos vein junction. The nasogastric tube extends into the stomach.  There is no pneumothorax. There is no large effusion. There is extensive vascular and interstitial congestion  IMPRESSION: 1.  Support equipment appears satisfactorily positioned. 2. The new left jugular central line appears  satisfactorily positioned. There is no pneumothorax. The nasogastric tube also is new from the prior study and appears satisfactorily positioned.   Electronically Signed   By: Andreas Newport M.D.   On: May 12, 2015 02:29   Dg Chest Port 1 View  04/27/2015   CLINICAL DATA:  Hypoxia  EXAM: PORTABLE CHEST - 1 VIEW  COMPARISON:  Feb 09, 2015 chest radiograph; chest CT February 24, 2015  FINDINGS: Endotracheal tube tip  is 4.3 cm above the carina. No pneumothorax. There is no edema or consolidation. There is a mass arising in the posterior segment of the right upper lobe which extends into and appears to invade the mediastinum. Heart size and pulmonary vascularity are within normal limits. No bone lesions.  IMPRESSION: Endotracheal tube as described without pneumothorax. Mass in the right upper lobe region extending into and apparently invading the mediastinum. This finding is much better appreciated on CT. No new opacity. No change in cardiac silhouette.   Electronically Signed   By: Lowella Grip III M.D.   On: 04/18/2015 14:50   Dg Abd Portable 1v  04/26/2015   CLINICAL DATA:  Orogastric tube placement  EXAM: PORTABLE ABDOMEN - 1 VIEW  COMPARISON:  None.  FINDINGS: Orogastric tube not seen. Stomach distended with air. No small or large bowel dilatation. No free air. Lung bases clear.  IMPRESSION: Orogastric tube not seen in the abdominal region. Stomach distended with fluid and air. Bowel gas pattern otherwise unremarkable. Lung bases clear.   Electronically Signed   By: Lowella Grip III M.D.   On: 04/23/2015 14:47    Medications: I have reviewed the patient's current medications.   Assessment/Plan: 1) metastatic non-small cell lung cancer, adenocarcinoma status post 1 cycle of systemic chemotherapy with carboplatin and paclitaxel given last week with Neulasta support. We will hold any further treatment for now until improvement of his condition and I will discuss with the patient his family other alternative therapy. 2) septic shock secondary to recent chemotherapy and neutropenia. Continue with the current supportive care for airway protection. Continue Zosyn and vancomycin. 3) chemotherapy-induced febrile neutropenia: We'll start the patient on Granix C 100 g subcutaneously on daily basis until his absolute neutrophil count is over 1000. 4) prognosis: Very poor with his current condition. Thank you for  taking good care of Mr. Holdren, I will continue to follow up the patient with you and assist in his management an as-needed basis.   LOS: 1 day    Nhu Glasby K. 05/12/2015

## 2015-05-18 NOTE — Discharge Summary (Addendum)
PULMONARY / CRITICAL CARE MEDICINE   Name: Todd Poole MRN: [id number] DOB: Dec 29, 1940    ADMISSION DATE:  04/27/2015 CONSULTATION DATE:  [name]  REFERRING MD :  Cathleen Fears   CHIEF COMPLAINT:  Altered MS,. sepsis  INITIAL PRESENTATION:  74 year old male w/ stage IV lung cancer (last chem 7/28). Admitted on 8/3 w/ delirium, agitation, temp >104 and hypoxic w/ sats <80. PCCM asked to admit,  STUDIES:   SIGNIFICANT EVENTS: 8/3 admitted w/ neutropenic sepsis; developed shock state over night 8/4 rising pressor requirement/ developing MODS. No culture data to explain source.   COURSE :  PULMONARY OETT 8/3 >> 8/4 A: Acute hypoxic respiratory failure (room air sats in 70s on admit. Suspect this is r/t poor circulatory support  CXR w/ interstitial changes but no clear PNA or infection source  P:   Full vent support   CARDIOVASCULAR CVL A:  Septic shock /MODS P:  Keep euvolemic Pressors for MAP goal > 65   RENAL A:  Acute on chronic renal failure , CKD stg 4-->worse Metabolic acidosis (lactic acidosis and NAG component) Mild hypokalemia Hypophosphatemia and hypomagnesemia  P:   IV hydration; change MIVF to bicarb and trend labs closely  Hold diuretics and antihypertensives Replace lytes    GASTROINTESTINAL A:  Severe Protein Calorie Malnutrition  P:   PPI bid   HEMATOLOGIC A:   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 Source unclear: ? UT or GI tract.  Remains febrile and in shock state.  Still no clear source.  P:   BCx2 8/3>>>ng UC 8/3>>>ng Sputum 8/3>>> Zosyn 8/3>>> vanc 8/3>>> CT abdo/pelvis 8/4>>> Markedly abnormal examination with gas within the gastric wall and portion of small bowel with extensive portal venous gas, superior  mesenteric vein gas and extensive gas within omental vessels. Findings are highly suggestive of bowel necrosis  ENDOCRINE A:   Risk of adrenal insuff   Hyperglycemia  P:   Cont solucortef as was on Decadron s/p chemo  ssi for glycemic control   NEUROLOGIC A:  Acute encephalopathy ->likely d/t sepsis and hypoxemia  Brain Mets  P:   RASS goal: -2 PAD protocol  Supportive care    FAMILY  - Updates: updated his son [name] at bedside. Goals of care discussed.   - Inter-disciplinary family meet or Palliative Care meeting due by: completed on admission w/ ICU and ER team as well as pt's son [name]. Goals of care discussed. Advanced directives reviewed. He wants aggressive care but would not want CPR should his condition worsen. He agreed  to pressors and central access.      SUMMARY: CT abdomen suggested bowel ischemia. He remained in refractory shock and developed worsening organ failure. After discussing with family, left support was withdrawn.  Cause of death - ischemic bowel, metastatic lung cancer, septic shock  Kara Mead MD. FCCP. [place] [facility] Critical [facility]    04/25/2015, 1:27 PM

## 2015-05-18 NOTE — Progress Notes (Signed)
PULMONARY / CRITICAL CARE MEDICINE   Name: Todd Poole MRN: [id number] DOB: 1941-02-09    ADMISSION DATE:  04/29/2015 CONSULTATION DATE:  Todd Poole  REFERRING MD :  Cathleen Fears   CHIEF COMPLAINT:  Altered MS,. sepsis  INITIAL PRESENTATION:  74 year old male w/ stage IV lung cancer (last chem 7/28). Admitted on 8/3 w/ delirium, agitation, temp >104 and hypoxic w/ sats <80. PCCM asked to admit,  STUDIES:   SIGNIFICANT EVENTS: 8/3 admitted w/ neutropenic sepsis; developed shock state over night 8/4 rising pressor requirement/ developing MODS. No culture data to explain source.   VITAL SIGNS: Temp:  [100.4 F (38 C)-104.7 F (40.4 C)] 102.2 F (39 C) (08/04 0700) Pulse Rate:  [94-138] 110 (08/04 0800) Resp:  [14-22] 14 (08/04 0800) BP: (82-151)/(36-79) 151/59 mmHg (08/04 0348) SpO2:  [80 %-100 %] 98 % (08/04 0800) Arterial Line BP: (69-146)/(27-62) 130/57 mmHg (08/04 0800) FiO2 (%):  [30 %-100 %] 30 % (08/04 0804) Weight:  [78.926 kg (174 lb)-82.2 kg (181 lb 3.5 oz)] 82.2 kg (181 lb 3.5 oz) (08/04 0500) HEMODYNAMICS:   VENTILATOR SETTINGS: Vent Mode:  [-] PRVC FiO2 (%):  [30 %-100 %] 30 % Set Rate:  [14 bmp] 14 bmp Vt Set:  [530 mL-580 mL] 580 mL PEEP:  [5 cmH20] 5 cmH20 Plateau Pressure:  [16 cmH20-18 cmH20] 17 cmH20 INTAKE / OUTPUT:  Intake/Output Summary (Last 24 hours) at May 11, 2015 0817 Last data filed at 11-May-2015 0739  Gross per 24 hour  Intake 1839.66 ml  Output    450 ml  Net 1389.66 ml    PHYSICAL EXAMINATION: General:  Critically ill appearing white male, remains sedated and on high pressor requirements  Neuro: agitated at times. Non-focal  HEENT:  Orally intubated poor dentition  Cardiovascular:  Tachy rrr Lungs:  Diffuse rhonchi  Abdomen:  Soft, no OM + bowel sounds  Musculoskeletal:  Generalized weakness  Skin:  Diffusely mottled -->this has worsened again since admit  LABS:  CBC  Recent Labs Lab 04/13/15 1157 04/18/2015 1340 05/11/2015 0400  WBC  23.3* 0.7* 0.6*  HGB 16.0 15.6 13.6  HCT 47.4 46.1 40.6  PLT 320 102* 91*   Coag's  Recent Labs Lab 05/16/2015 1404  APTT 33  INR 1.36   BMET  Recent Labs Lab 04/13/15 1157 04/17/15 0846 05/07/2015 1340 2015-05-11 0400  NA 134*  --  136 130*  K 3.3*  --  3.3* 3.1*  CL  --   --  94* 102  CO2 25  --  24 17*  BUN 42.0* 36.8* 51* 48*  CREATININE 2.2* 1.7* 2.71* 2.80*  GLUCOSE 150*  --  187* 204*   Electrolytes  Recent Labs Lab 04/13/15 1157 04/22/2015 1340 04/29/2015 1404 05-11-15 0400  CALCIUM 9.6 8.3*  --  6.3*  MG  --   --   --  1.6*  PHOS  --   --  1.6* 2.3*   Sepsis Markers  Recent Labs Lab 04/26/2015 1355 04/25/2015 1404 04/25/2015 1835 05/01/2015 2059 11-May-2015 0400  LATICACIDVEN 6.65*  --  2.7* 3.2*  --   PROCALCITON  --  15.20  --   --  66.17   ABG  Recent Labs Lab 05/03/2015 1535 05-11-15 0353  PHART 7.312* 7.257*  PCO2ART 44.9 41.4  PO2ART 538* 118*   Liver Enzymes  Recent Labs Lab 04/13/15 1157 05/12/2015 1340  AST 19 43*  ALT 17 32  ALKPHOS 84 66  BILITOT 0.64 1.8*  ALBUMIN 3.7 3.6   Cardiac Enzymes  Recent Labs Lab 04/17/2015 1404  TROPONINI 0.05*   Glucose  Recent Labs Lab 05/13/2015 1651 04/17/2015 1953 05/03/2015 0024 May 03, 2015 0356 03-May-2015 0733  GLUCAP 204* 202* 167* 193* 133*    Imaging Dg Abd 1 View  04/27/2015   CLINICAL DATA:  Orogastric tube placement.  EXAM: ABDOMEN - 1 VIEW  COMPARISON:  04/22/2015 at 14:11 p.m.  FINDINGS: Examination demonstrates an enteric tube looped once over the stomach with tip just right of midline likely over the distal stomach. Bowel gas pattern is nonobstructive. Remainder of the exam is unchanged.  IMPRESSION: No acute findings.  Enteric tube with tip just right of midline likely over the distal stomach.   Electronically Signed   By: Marin Olp M.D.   On: 04/21/2015 17:29   Dg Chest Port 1 View  05-03-15   CLINICAL DATA:  Central line placement  EXAM: PORTABLE CHEST - 1 VIEW  COMPARISON:   05/03/2015  FINDINGS: Endotracheal tube is 5.5 cm above the carina. There is a new left jugular central line with tip in the expected location of the SVC at the azygos vein junction. The nasogastric tube extends into the stomach.  There is no pneumothorax. There is no large effusion. There is extensive vascular and interstitial congestion  IMPRESSION: 1.  Support equipment appears satisfactorily positioned. 2. The new left jugular central line appears satisfactorily positioned. There is no pneumothorax. The nasogastric tube also is new from the prior study and appears satisfactorily positioned.   Electronically Signed   By: Andreas Newport M.D.   On: 05/03/2015 02:29   Dg Chest Port 1 View  05/02/2015   CLINICAL DATA:  Hypoxia  EXAM: PORTABLE CHEST - 1 VIEW  COMPARISON:  Feb 09, 2015 chest radiograph; chest CT February 24, 2015  FINDINGS: Endotracheal tube tip is 4.3 cm above the carina. No pneumothorax. There is no edema or consolidation. There is a mass arising in the posterior segment of the right upper lobe which extends into and appears to invade the mediastinum. Heart size and pulmonary vascularity are within normal limits. No bone lesions.  IMPRESSION: Endotracheal tube as described without pneumothorax. Mass in the right upper lobe region extending into and apparently invading the mediastinum. This finding is much better appreciated on CT. No new opacity. No change in cardiac silhouette.   Electronically Signed   By: Lowella Grip III M.D.   On: 05/09/2015 14:50   Dg Abd Portable 1v  05/02/2015   CLINICAL DATA:  Orogastric tube placement  EXAM: PORTABLE ABDOMEN - 1 VIEW  COMPARISON:  None.  FINDINGS: Orogastric tube not seen. Stomach distended with air. No small or large bowel dilatation. No free air. Lung bases clear.  IMPRESSION: Orogastric tube not seen in the abdominal region. Stomach distended with fluid and air. Bowel gas pattern otherwise unremarkable. Lung bases clear.   Electronically Signed    By: Lowella Grip III M.D.   On: 04/25/2015 14:47  interstitial changes but no clear infiltrates    ASSESSMENT / PLAN:  PULMONARY OETT 8/3 A: Acute hypoxic respiratory failure (room air sats in 70s on admit. Suspect this is r/t poor circulatory support  CXR w/ interstitial changes but no clear PNA or infection source  P:   Full vent support  PAD protocol  F/u abg and PCXR   CARDIOVASCULAR CVL A:  Septic shock /MODS P:  Keep euvolemic Pressors for MAP goal > 65 See ID and endocrine section   RENAL A:  Acute on chronic  renal failure -->worse Metabolic acidosis (lactic acidosis and NAG component) Mild hypokalemia Hypophosphatemia and hypomagnesemia  P:   IV hydration; change MIVF to bicarb and trend labs closely  Hold diuretics and antihypertensives See CV section  Replace lytes    GASTROINTESTINAL A:  Severe Protein Calorie Malnutrition  P:   PPI bid F/u CBC   HEMATOLOGIC A:   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 Source unclear: ? UT or GI tract.  Remains febrile and in shock state.  Still no clear source.  P:   BCx2 8/3>>> UC 8/3>>> Sputum 8/3>>> Zosyn 8/3>>> vanc 8/3>>> CT abdo/pelvis 8/4>>>  ENDOCRINE A:   Risk of adrenal insuff   Hyperglycemia  P:   Cont solucortef as was on Decadron s/p chemo  ssi for glycemic control   NEUROLOGIC A:  Acute encephalopathy ->likely d/t sepsis and hypoxemia  Brain Mets  P:   RASS goal: -2 PAD protocol  Supportive care    FAMILY  - Updates: updated his son [name] at bedside. Goals of care discussed.   - Inter-disciplinary family meet or Palliative Care meeting due by: completed on admission w/ ICU and ER team as well as pt's son [name]. Goals of care discussed. Advanced  directives reviewed. He wants aggressive care but would not want CPR should his condition worsen. He does agree to pressors and central access. The son understands that Mr Wacha is critically ill.     TODAY'S SUMMARY:  Has progressed to multiple organ failure and persistent shock state. Still no obvious source but wonder about gut ischemia and bacterial translocation. Did have heme+ stool. We will cont current support. CT abd/pelvis to look for reversible source. Prognosis grim at best. Family updated. He is DNR    Erick Colace ACNP-BC [place] Pager # [phone] OR # [phone] if no answer    05-14-15, 8:17 AM

## 2015-05-18 DEATH — deceased

## 2015-08-24 ENCOUNTER — Ambulatory Visit: Payer: Medicare Other | Admitting: Family

## 2015-08-24 ENCOUNTER — Encounter (HOSPITAL_COMMUNITY): Payer: Medicare Other

## 2015-09-13 ENCOUNTER — Other Ambulatory Visit: Payer: Self-pay | Admitting: Nurse Practitioner

## 2015-12-14 ENCOUNTER — Encounter: Payer: Self-pay | Admitting: Cardiology

## 2016-11-18 IMAGING — CT NM PET TUM IMG INITIAL (PI) SKULL BASE T - THIGH
1 of 8 series · 1 of 25 positions shown · non-contrast
Comparison: Limited correlation is made with the reformatted images
from an outside chest CT performed 02/24/2015. The axial images from
that study and the report are not available.

CLINICAL DATA: Initial treatment strategy for right lung mass.
History of melanoma.

EXAM:
NUCLEAR MEDICINE PET WHOLE BODY
TECHNIQUE: 8.7 mCi F-18 FDG was injected intravenously. Full-ring PET imaging
was performed from the vertex to the feet after the radiotracer. CT
data was obtained and used for attenuation correction and anatomic
localization.
FASTING BLOOD GLUCOSE:  Value: 88 mg/dl

[Series 3: pet wb ac · axial · 5.0mm · 4.07mm/px · 1 of 451 slices shown]
[im 226/451]
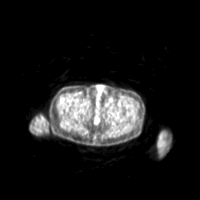

[1 of 25 positions shown; findings below may reference images not displayed]

FINDINGS: NECK

No hypermetabolic cervical lymph nodes are identified.There are no
lesions of the pharyngeal mucosal space. No obvious intracranial
lesions are identified. However, there are small hypermetabolic
nodules within the scalp which are suspicious for metastatic
disease.

CHEST

There is extensive hypermetabolic tumor throughout the right
hemithorax. There is a large right retro hilar mass with subcarinal
extension, measuring up to 6.5 x 4.0 cm on image 98. This may invade
the esophagus. This lesion is hypermetabolic with an SUV max of
15.5. In addition, there are several smaller hypermetabolic right
paratracheal and right hilar lymph nodes appear There is nodular
hypermetabolic activity associated with a mildly complex partially
loculated right pleural effusion. There are several hypermetabolic
right-sided pulmonary nodules, including a subpleural right upper
lobe lesion measuring 1.7 cm on image 24 and a 1.5 cm lesion in the
right lower lobe on image 36. There are no suspicious left-sided
pulmonary nodules.

ABDOMEN/PELVIS

There are multiple hypermetabolic liver lesions consistent with
metastatic disease. The lesions are not well seen on the noncontrast
images, in part secondary to extensive hepatic steatosis. A lesion
in the medial segment of the left lobe has an SUV max of 12.1. There
is a hypermetabolic right adrenal nodule measuring 2.6 x 1.8 cm on
image 137 (SUV max 7.6). The left adrenal gland appears normal.
There is no abnormal activity within the spleen or pancreas. There
are small hypermetabolic lymph nodes within the porta hepatis, upper
retroperitoneum and retrocrural space.

SKELETON

There is multifocal hypermetabolic osseous metastatic disease. There
is prominent involvement within the sternal manubrium (SUV max 10.6)
and within the T10 vertebral body (SUV max 8.6). No definite
pathologic fractures identified.

Extremities: There is a hypermetabolic mass within the medial left
calf. This involves the gastrocnemius musculature and has an SUV max
of 17.9. Small hypermetabolic soft tissue nodules are present within
the proximal thigh musculature as well as in the erector spinae
musculature. No definite abnormal activity seen within the upper
extremities.
IMPRESSION: 1. Extensive metastatic disease with dominant right retro hilar mass
which may reflect primary bronchogenic carcinoma. Metastases are
present within multiple lymph nodes, the liver, the right adrenal
gland, the bones and soft tissues. This distribution can be seen
with metastatic melanoma.
2. Metastatic disease is also present within the right lung and
pleural space.
3. Prominent hypermetabolic soft tissue mass within the left calf.
